# Patient Record
Sex: Female | Born: 1960
Health system: Southern US, Community
[De-identification: ages and names within clinical notes are randomized; demographics above are authoritative.]

## PROBLEM LIST (undated history)

## (undated) DIAGNOSIS — S83106A Unspecified dislocation of unspecified knee, initial encounter: Secondary | ICD-10-CM

## (undated) DIAGNOSIS — J9819 Other pulmonary collapse: Secondary | ICD-10-CM

## (undated) DIAGNOSIS — K648 Other hemorrhoids: Secondary | ICD-10-CM

## (undated) DIAGNOSIS — I619 Nontraumatic intracerebral hemorrhage, unspecified: Secondary | ICD-10-CM

## (undated) DIAGNOSIS — M329 Systemic lupus erythematosus, unspecified: Secondary | ICD-10-CM

## (undated) DIAGNOSIS — S6291XA Unspecified fracture of right wrist and hand, initial encounter for closed fracture: Secondary | ICD-10-CM

## (undated) DIAGNOSIS — D860 Sarcoidosis of lung: Secondary | ICD-10-CM

## (undated) DIAGNOSIS — D126 Benign neoplasm of colon, unspecified: Secondary | ICD-10-CM

## (undated) DIAGNOSIS — N029 Recurrent and persistent hematuria with unspecified morphologic changes: Secondary | ICD-10-CM

## (undated) DIAGNOSIS — IMO0002 Reserved for concepts with insufficient information to code with codable children: Secondary | ICD-10-CM

## (undated) DIAGNOSIS — S42009A Fracture of unspecified part of unspecified clavicle, initial encounter for closed fracture: Secondary | ICD-10-CM

## (undated) HISTORY — DX: Unspecified fracture of right wrist and hand, initial encounter for closed fracture: S62.91XA

## (undated) HISTORY — DX: Nontraumatic intracerebral hemorrhage, unspecified: I61.9

## (undated) HISTORY — DX: Sarcoidosis of lung: D86.0

## (undated) HISTORY — DX: Other pulmonary collapse: J98.19

## (undated) HISTORY — DX: Systemic lupus erythematosus, unspecified: M32.9

## (undated) HISTORY — DX: Other hemorrhoids: K64.8

## (undated) HISTORY — DX: Benign neoplasm of colon, unspecified: D12.6

## (undated) HISTORY — PX: ARTERIAL BYPASS SURGRY: SHX557

## (undated) HISTORY — DX: Fracture of unspecified part of unspecified clavicle, initial encounter for closed fracture: S42.009A

## (undated) HISTORY — PX: CYSTOSCOPY: SUR368

## (undated) HISTORY — DX: Recurrent and persistent hematuria with unspecified morphologic changes: N02.9

## (undated) HISTORY — PX: COLLATERAL LIGAMENT REPAIR, KNEE: SHX601

## (undated) HISTORY — DX: Reserved for concepts with insufficient information to code with codable children: IMO0002

## (undated) HISTORY — DX: Unspecified dislocation of unspecified knee, initial encounter: S83.106A

---

## 1997-09-09 ENCOUNTER — Ambulatory Visit (HOSPITAL_COMMUNITY): Admission: RE | Admit: 1997-09-09 | Discharge: 1997-09-09 | Payer: Self-pay | Admitting: Obstetrics and Gynecology

## 1997-10-01 ENCOUNTER — Ambulatory Visit (HOSPITAL_COMMUNITY): Admission: RE | Admit: 1997-10-01 | Discharge: 1997-10-01 | Payer: Self-pay | Admitting: Obstetrics and Gynecology

## 1997-10-11 ENCOUNTER — Inpatient Hospital Stay (HOSPITAL_COMMUNITY): Admission: AD | Admit: 1997-10-11 | Discharge: 1997-10-11 | Payer: Self-pay | Admitting: Obstetrics and Gynecology

## 1997-10-12 ENCOUNTER — Encounter (HOSPITAL_COMMUNITY): Admission: RE | Admit: 1997-10-12 | Discharge: 1997-11-08 | Payer: Self-pay | Admitting: Obstetrics and Gynecology

## 1997-11-01 ENCOUNTER — Inpatient Hospital Stay (HOSPITAL_COMMUNITY): Admission: AD | Admit: 1997-11-01 | Discharge: 1997-11-01 | Payer: Self-pay | Admitting: Obstetrics & Gynecology

## 1997-11-02 ENCOUNTER — Inpatient Hospital Stay (HOSPITAL_COMMUNITY): Admission: AD | Admit: 1997-11-02 | Discharge: 1997-11-08 | Payer: Self-pay | Admitting: Obstetrics and Gynecology

## 1997-11-12 ENCOUNTER — Other Ambulatory Visit: Admission: RE | Admit: 1997-11-12 | Discharge: 1997-11-12 | Payer: Self-pay | Admitting: Obstetrics & Gynecology

## 1997-11-17 ENCOUNTER — Encounter (HOSPITAL_COMMUNITY): Admission: RE | Admit: 1997-11-17 | Discharge: 1998-02-15 | Payer: Self-pay | Admitting: *Deleted

## 1997-12-13 ENCOUNTER — Ambulatory Visit (HOSPITAL_COMMUNITY): Admission: RE | Admit: 1997-12-13 | Discharge: 1997-12-13 | Payer: Self-pay | Admitting: Obstetrics and Gynecology

## 1998-06-23 ENCOUNTER — Other Ambulatory Visit: Admission: RE | Admit: 1998-06-23 | Discharge: 1998-06-23 | Payer: Self-pay | Admitting: Oral Surgery

## 1999-08-07 HISTORY — PX: SPLENECTOMY: SUR1306

## 1999-08-07 HISTORY — PX: OTHER SURGICAL HISTORY: SHX169

## 2000-02-26 ENCOUNTER — Other Ambulatory Visit: Admission: RE | Admit: 2000-02-26 | Discharge: 2000-02-26 | Payer: Self-pay | Admitting: Obstetrics and Gynecology

## 2000-02-29 ENCOUNTER — Encounter: Payer: Self-pay | Admitting: Obstetrics and Gynecology

## 2000-02-29 ENCOUNTER — Ambulatory Visit (HOSPITAL_COMMUNITY): Admission: RE | Admit: 2000-02-29 | Discharge: 2000-02-29 | Payer: Self-pay | Admitting: Obstetrics and Gynecology

## 2000-12-29 ENCOUNTER — Inpatient Hospital Stay (HOSPITAL_COMMUNITY): Admission: AC | Admit: 2000-12-29 | Discharge: 2001-01-21 | Payer: Self-pay | Admitting: *Deleted

## 2000-12-29 ENCOUNTER — Encounter (INDEPENDENT_AMBULATORY_CARE_PROVIDER_SITE_OTHER): Payer: Self-pay | Admitting: Specialist

## 2000-12-30 ENCOUNTER — Encounter: Payer: Self-pay | Admitting: General Surgery

## 2000-12-30 ENCOUNTER — Encounter: Payer: Self-pay | Admitting: Orthopedic Surgery

## 2001-01-01 ENCOUNTER — Encounter: Payer: Self-pay | Admitting: General Surgery

## 2001-01-02 ENCOUNTER — Encounter: Payer: Self-pay | Admitting: General Surgery

## 2001-01-06 ENCOUNTER — Encounter: Payer: Self-pay | Admitting: Orthopedic Surgery

## 2001-01-07 ENCOUNTER — Encounter: Payer: Self-pay | Admitting: General Surgery

## 2001-01-09 ENCOUNTER — Encounter: Payer: Self-pay | Admitting: General Surgery

## 2001-01-10 ENCOUNTER — Encounter: Payer: Self-pay | Admitting: General Surgery

## 2001-01-12 ENCOUNTER — Encounter: Payer: Self-pay | Admitting: General Surgery

## 2001-01-15 ENCOUNTER — Encounter: Payer: Self-pay | Admitting: General Surgery

## 2001-01-21 ENCOUNTER — Inpatient Hospital Stay (HOSPITAL_COMMUNITY)
Admission: RE | Admit: 2001-01-21 | Discharge: 2001-01-28 | Payer: Self-pay | Admitting: Physical Medicine & Rehabilitation

## 2001-02-04 ENCOUNTER — Encounter: Admission: RE | Admit: 2001-02-04 | Discharge: 2001-04-17 | Payer: Self-pay | Admitting: Orthopedic Surgery

## 2001-02-19 ENCOUNTER — Encounter: Admission: RE | Admit: 2001-02-19 | Discharge: 2001-04-23 | Payer: Self-pay | Admitting: Orthopaedic Surgery

## 2001-08-06 HISTORY — PX: HERNIA REPAIR: SHX51

## 2001-12-18 ENCOUNTER — Ambulatory Visit (HOSPITAL_COMMUNITY): Admission: RE | Admit: 2001-12-18 | Discharge: 2001-12-18 | Payer: Self-pay | Admitting: Orthopaedic Surgery

## 2001-12-18 ENCOUNTER — Encounter: Payer: Self-pay | Admitting: Orthopaedic Surgery

## 2002-01-05 ENCOUNTER — Ambulatory Visit (HOSPITAL_COMMUNITY): Admission: RE | Admit: 2002-01-05 | Discharge: 2002-01-05 | Payer: Self-pay | Admitting: *Deleted

## 2002-01-06 ENCOUNTER — Ambulatory Visit (HOSPITAL_BASED_OUTPATIENT_CLINIC_OR_DEPARTMENT_OTHER): Admission: RE | Admit: 2002-01-06 | Discharge: 2002-01-06 | Payer: Self-pay | Admitting: Oral Surgery

## 2002-02-20 ENCOUNTER — Encounter: Payer: Self-pay | Admitting: Surgery

## 2002-02-27 ENCOUNTER — Inpatient Hospital Stay (HOSPITAL_COMMUNITY): Admission: RE | Admit: 2002-02-27 | Discharge: 2002-03-01 | Payer: Self-pay | Admitting: Surgery

## 2002-05-25 ENCOUNTER — Other Ambulatory Visit: Admission: RE | Admit: 2002-05-25 | Discharge: 2002-05-25 | Payer: Self-pay | Admitting: Obstetrics and Gynecology

## 2002-08-25 ENCOUNTER — Encounter: Payer: Self-pay | Admitting: Family Medicine

## 2002-08-25 ENCOUNTER — Ambulatory Visit (HOSPITAL_COMMUNITY): Admission: RE | Admit: 2002-08-25 | Discharge: 2002-08-25 | Payer: Self-pay | Admitting: Family Medicine

## 2003-01-26 ENCOUNTER — Encounter: Payer: Self-pay | Admitting: *Deleted

## 2003-01-26 ENCOUNTER — Ambulatory Visit (HOSPITAL_COMMUNITY): Admission: RE | Admit: 2003-01-26 | Discharge: 2003-01-26 | Payer: Self-pay | Admitting: *Deleted

## 2003-04-06 ENCOUNTER — Encounter: Admission: RE | Admit: 2003-04-06 | Discharge: 2003-07-05 | Payer: Self-pay | Admitting: Orthopaedic Surgery

## 2003-05-27 ENCOUNTER — Other Ambulatory Visit: Admission: RE | Admit: 2003-05-27 | Discharge: 2003-05-27 | Payer: Self-pay | Admitting: Obstetrics and Gynecology

## 2003-10-19 ENCOUNTER — Ambulatory Visit (HOSPITAL_BASED_OUTPATIENT_CLINIC_OR_DEPARTMENT_OTHER): Admission: RE | Admit: 2003-10-19 | Discharge: 2003-10-19 | Payer: Self-pay | Admitting: Orthopaedic Surgery

## 2003-11-05 ENCOUNTER — Encounter: Admission: RE | Admit: 2003-11-05 | Discharge: 2004-02-03 | Payer: Self-pay | Admitting: Orthopaedic Surgery

## 2004-02-04 ENCOUNTER — Encounter: Admission: RE | Admit: 2004-02-04 | Discharge: 2004-03-09 | Payer: Self-pay | Admitting: Orthopaedic Surgery

## 2004-05-12 ENCOUNTER — Ambulatory Visit (HOSPITAL_COMMUNITY): Admission: RE | Admit: 2004-05-12 | Discharge: 2004-05-12 | Payer: Self-pay | Admitting: Obstetrics and Gynecology

## 2004-07-04 ENCOUNTER — Other Ambulatory Visit: Admission: RE | Admit: 2004-07-04 | Discharge: 2004-07-04 | Payer: Self-pay | Admitting: Obstetrics and Gynecology

## 2004-10-16 ENCOUNTER — Ambulatory Visit: Payer: Self-pay | Admitting: Family Medicine

## 2005-03-20 ENCOUNTER — Ambulatory Visit: Payer: Self-pay | Admitting: Family Medicine

## 2005-08-10 ENCOUNTER — Ambulatory Visit (HOSPITAL_COMMUNITY): Admission: RE | Admit: 2005-08-10 | Discharge: 2005-08-10 | Payer: Self-pay | Admitting: Obstetrics and Gynecology

## 2005-08-22 ENCOUNTER — Other Ambulatory Visit: Admission: RE | Admit: 2005-08-22 | Discharge: 2005-08-22 | Payer: Self-pay | Admitting: Obstetrics and Gynecology

## 2006-01-22 ENCOUNTER — Other Ambulatory Visit: Admission: RE | Admit: 2006-01-22 | Discharge: 2006-01-22 | Payer: Self-pay | Admitting: Obstetrics and Gynecology

## 2006-04-22 ENCOUNTER — Other Ambulatory Visit: Admission: RE | Admit: 2006-04-22 | Discharge: 2006-04-22 | Payer: Self-pay | Admitting: Obstetrics and Gynecology

## 2007-01-23 ENCOUNTER — Ambulatory Visit: Payer: Self-pay | Admitting: Family Medicine

## 2007-01-23 LAB — CONVERTED CEMR LAB
Albumin: 3.5 g/dL (ref 3.5–5.2)
Alkaline Phosphatase: 93 units/L (ref 39–117)
BUN: 11 mg/dL (ref 6–23)
Basophils Absolute: 0.1 10*3/uL (ref 0.0–0.1)
Chloride: 107 meq/L (ref 96–112)
Cholesterol: 234 mg/dL (ref 0–200)
Creatinine, Ser: 0.7 mg/dL (ref 0.4–1.2)
MCHC: 34.3 g/dL (ref 30.0–36.0)
Monocytes Relative: 16.5 % — ABNORMAL HIGH (ref 3.0–11.0)
Platelets: 265 10*3/uL (ref 150–400)
Potassium: 3.9 meq/L (ref 3.5–5.1)
RBC: 4.26 M/uL (ref 3.87–5.11)
RDW: 12.9 % (ref 11.5–14.6)
TSH: 1.89 microintl units/mL (ref 0.35–5.50)
Total Bilirubin: 0.7 mg/dL (ref 0.3–1.2)
Total CHOL/HDL Ratio: 5.4
Triglycerides: 86 mg/dL (ref 0–149)

## 2007-03-03 ENCOUNTER — Inpatient Hospital Stay (HOSPITAL_COMMUNITY): Admission: EM | Admit: 2007-03-03 | Discharge: 2007-03-10 | Payer: Self-pay | Admitting: Orthopaedic Surgery

## 2007-03-03 ENCOUNTER — Encounter: Payer: Self-pay | Admitting: Emergency Medicine

## 2007-03-03 ENCOUNTER — Ambulatory Visit: Payer: Self-pay | Admitting: Vascular Surgery

## 2007-03-18 ENCOUNTER — Encounter: Payer: Self-pay | Admitting: Family Medicine

## 2007-03-18 ENCOUNTER — Ambulatory Visit: Payer: Self-pay | Admitting: Vascular Surgery

## 2007-05-08 ENCOUNTER — Encounter: Admission: RE | Admit: 2007-05-08 | Discharge: 2007-08-06 | Payer: Self-pay | Admitting: Orthopaedic Surgery

## 2007-05-12 ENCOUNTER — Ambulatory Visit: Payer: Self-pay | Admitting: Surgery

## 2007-06-17 ENCOUNTER — Ambulatory Visit: Payer: Self-pay | Admitting: Vascular Surgery

## 2007-06-23 ENCOUNTER — Ambulatory Visit (HOSPITAL_COMMUNITY): Admission: RE | Admit: 2007-06-23 | Discharge: 2007-06-23 | Payer: Self-pay | Admitting: Obstetrics and Gynecology

## 2007-08-08 ENCOUNTER — Encounter: Admission: RE | Admit: 2007-08-08 | Discharge: 2007-11-06 | Payer: Self-pay | Admitting: Orthopaedic Surgery

## 2007-08-12 ENCOUNTER — Ambulatory Visit (HOSPITAL_BASED_OUTPATIENT_CLINIC_OR_DEPARTMENT_OTHER): Admission: RE | Admit: 2007-08-12 | Discharge: 2007-08-13 | Payer: Self-pay | Admitting: Orthopaedic Surgery

## 2007-08-19 ENCOUNTER — Encounter: Admission: RE | Admit: 2007-08-19 | Discharge: 2007-10-29 | Payer: Self-pay | Admitting: Orthopaedic Surgery

## 2007-09-01 ENCOUNTER — Encounter: Payer: Self-pay | Admitting: Family Medicine

## 2007-09-01 ENCOUNTER — Ambulatory Visit: Payer: Self-pay | Admitting: Vascular Surgery

## 2007-10-20 ENCOUNTER — Telehealth: Payer: Self-pay | Admitting: Family Medicine

## 2007-11-06 ENCOUNTER — Ambulatory Visit: Payer: Self-pay | Admitting: Family Medicine

## 2007-11-06 DIAGNOSIS — M329 Systemic lupus erythematosus, unspecified: Secondary | ICD-10-CM | POA: Insufficient documentation

## 2007-11-06 DIAGNOSIS — E663 Overweight: Secondary | ICD-10-CM | POA: Insufficient documentation

## 2007-11-06 DIAGNOSIS — D869 Sarcoidosis, unspecified: Secondary | ICD-10-CM | POA: Insufficient documentation

## 2007-12-30 ENCOUNTER — Ambulatory Visit: Payer: Self-pay | Admitting: Family Medicine

## 2007-12-30 LAB — CONVERTED CEMR LAB
Alkaline Phosphatase: 97 units/L (ref 39–117)
Basophils Absolute: 0.1 10*3/uL (ref 0.0–0.1)
Bilirubin, Direct: 0.1 mg/dL (ref 0.0–0.3)
Blood in Urine, dipstick: NEGATIVE
Calcium: 9.6 mg/dL (ref 8.4–10.5)
Cholesterol: 197 mg/dL (ref 0–200)
Eosinophils Absolute: 0.1 10*3/uL (ref 0.0–0.7)
GFR calc Af Amer: 99 mL/min
GFR calc non Af Amer: 82 mL/min
HCT: 43.6 % (ref 36.0–46.0)
HDL: 37.4 mg/dL — ABNORMAL LOW (ref >39.0)
MCHC: 33.7 g/dL (ref 30.0–36.0)
MCV: 96.1 fL (ref 78.0–100.0)
Monocytes Absolute: 0.8 10*3/uL (ref 0.1–1.0)
Nitrite: NEGATIVE
Platelets: 252 10*3/uL (ref 150–400)
Potassium: 4.2 meq/L (ref 3.5–5.1)
RDW: 14 % (ref 11.5–14.6)
Sodium: 141 meq/L (ref 135–145)
Specific Gravity, Urine: 1.02
Total CHOL/HDL Ratio: 5.3
Triglycerides: 87 mg/dL (ref 0–149)
pH: 6

## 2008-01-06 ENCOUNTER — Ambulatory Visit: Payer: Self-pay | Admitting: Family Medicine

## 2008-04-08 ENCOUNTER — Telehealth: Payer: Self-pay | Admitting: Family Medicine

## 2008-07-12 ENCOUNTER — Ambulatory Visit (HOSPITAL_COMMUNITY): Admission: RE | Admit: 2008-07-12 | Discharge: 2008-07-12 | Payer: Self-pay | Admitting: Obstetrics and Gynecology

## 2009-06-08 ENCOUNTER — Ambulatory Visit: Payer: Self-pay | Admitting: Family Medicine

## 2009-06-08 LAB — CONVERTED CEMR LAB
Bilirubin Urine: NEGATIVE
Glucose, Urine, Semiquant: NEGATIVE
Ketones, urine, test strip: NEGATIVE
Protein, U semiquant: NEGATIVE
Urobilinogen, UA: 0.2
pH: 5

## 2009-06-10 LAB — CONVERTED CEMR LAB
ALT: 32 units/L (ref 0–35)
AST: 35 units/L (ref 0–37)
Albumin: 3.8 g/dL (ref 3.5–5.2)
Alkaline Phosphatase: 101 units/L (ref 39–117)
BUN: 9 mg/dL (ref 6–23)
Basophils Absolute: 0 10*3/uL (ref 0.0–0.1)
Basophils Relative: 0.9 % (ref 0.0–3.0)
Bilirubin, Direct: 0.1 mg/dL (ref 0.0–0.3)
CO2: 27 meq/L (ref 19–32)
Calcium: 9.1 mg/dL (ref 8.4–10.5)
Chloride: 103 meq/L (ref 96–112)
Cholesterol: 195 mg/dL (ref 0–200)
Creatinine, Ser: 0.7 mg/dL (ref 0.4–1.2)
Eosinophils Absolute: 0.2 10*3/uL (ref 0.0–0.7)
Eosinophils Relative: 2.8 % (ref 0.0–5.0)
GFR calc non Af Amer: 114.3 mL/min (ref >60)
Glucose, Bld: 83 mg/dL (ref 70–99)
HCT: 41.7 % (ref 36.0–46.0)
HDL: 44.1 mg/dL (ref >39.00)
Hemoglobin: 14.3 g/dL (ref 12.0–15.0)
LDL Cholesterol: 126 mg/dL — ABNORMAL HIGH (ref 0–99)
Lymphocytes Relative: 31.7 % (ref 12.0–46.0)
Lymphs Abs: 1.7 10*3/uL (ref 0.7–4.0)
MCHC: 34.2 g/dL (ref 30.0–36.0)
MCV: 100 fL (ref 78.0–100.0)
Monocytes Absolute: 0.5 10*3/uL (ref 0.1–1.0)
Monocytes Relative: 9.7 % (ref 3.0–12.0)
Neutro Abs: 3 10*3/uL (ref 1.4–7.7)
Neutrophils Relative %: 54.9 % (ref 43.0–77.0)
Platelets: 164 10*3/uL (ref 150.0–400.0)
Potassium: 3.9 meq/L (ref 3.5–5.1)
RBC: 4.16 M/uL (ref 3.87–5.11)
RDW: 12.6 % (ref 11.5–14.6)
Sodium: 141 meq/L (ref 135–145)
TSH: 1.87 microintl units/mL (ref 0.35–5.50)
Testosterone: 37.01 ng/dL (ref 10.00–70.00)
Total Bilirubin: 0.9 mg/dL (ref 0.3–1.2)
Total CHOL/HDL Ratio: 4
Total Protein: 8.2 g/dL (ref 6.0–8.3)
Triglycerides: 125 mg/dL (ref 0.0–149.0)
VLDL: 25 mg/dL (ref 0.0–40.0)
WBC: 5.4 10*3/uL (ref 4.5–10.5)

## 2009-06-21 ENCOUNTER — Ambulatory Visit: Payer: Self-pay | Admitting: Family Medicine

## 2009-06-22 ENCOUNTER — Telehealth: Payer: Self-pay | Admitting: Family Medicine

## 2009-07-05 ENCOUNTER — Telehealth: Payer: Self-pay | Admitting: Family Medicine

## 2009-07-13 ENCOUNTER — Ambulatory Visit (HOSPITAL_COMMUNITY): Admission: RE | Admit: 2009-07-13 | Discharge: 2009-07-13 | Payer: Self-pay | Admitting: Obstetrics and Gynecology

## 2010-08-27 ENCOUNTER — Encounter: Payer: Self-pay | Admitting: Obstetrics and Gynecology

## 2010-12-19 NOTE — Op Note (Signed)
NAME:  Stephanie Schmidt, Stephanie Schmidt            ACCOUNT NO.:  [id number]   MEDICAL RECORD NO.:  [id number]          PATIENT TYPE:  AMB   LOCATION:  DSC                          FACILITY:  [facility]   PHYSICIAN:  Lubertha Basque. Dalldorf, M.D.DATE OF BIRTH:  1961-05-12   DATE OF PROCEDURE:  08/12/2007  DATE OF DISCHARGE:                               OPERATIVE REPORT   PREOPERATIVE DIAGNOSIS:  1. Left knee instability.  2. Left knee chondromalacia.   POSTOPERATIVE DIAGNOSIS:  1. Left knee instability.  2. Left knee chondromalacia.   PROCEDURE:  1. Left knee anterior cruciate ligament reconstruction.  2. Left knee chondroplasty medial femoral condyle.   ANESTHESIA:  General and block.   ATTENDING SURGEON:  Lubertha Basque. Jerl Santos, M.D.   ASSISTANT:  Lindwood Qua, P.A.-C.   INDICATIONS FOR PROCEDURE:  The patient is a 50 year old woman with a  long complicated history of left knee difficulty.  She suffered a  dislocation several years ago treated with immobilization.  Unfortunately last year she suffered a repeat dislocation including the  complication of a vascular injury requiring repair.  She was treated for  instability with an external fixator for several months.  This was  subsequently removed.  The patient went through physical therapy and a  manipulation and now has been left with a stiff and unfortunately  unstable knee.  She is offered ACL reconstruction in hopes of  stabilizing this somewhat.  Informed operative consent was obtained  after a discussion of possible complications of reaction to anesthesia,  infection, neurovascular injury.   SUMMARY OF FINDINGS AND PROCEDURE:  Under general anesthesia and a  block, a left knee procedure was performed.  We started with an  arthroscopy.  Her motion was about 0 to 90 asleep and she had good  stability to varus and valgus stress.  She had a laxity to drawer and  Lachman's testing in both directions.  The arthroscopy revealed some  grade 3  change of the patellofemoral joint with grade 3 change here and  in a dime size area of the medial femoral condyle and some central grade  4 change in the medial compartment.  A chondroplasty was done here.  Meniscal structures were intact in both compartments.  The ACL was  absent while there was remnant PCL.  We reconstructed the ACL with  middle third patellar tendon allograft stabilized at both ends with  metal Linvatek screws.  Bryna Colander assisted throughout and fashioned  the graft on the back table while I performed most of the arthroscopy,  thereby significantly minimizing OR time.   DESCRIPTION OF PROCEDURE:  The patient was taken to the operating suite  where general anesthetic was applied without difficulty.  She was also  given a block in the preanesthesia area.  She was positioned supine and  prepped and draped in a normal sterile fashion.  After the surgery of IV  Kefzol, an arthroscopy of the left knee was performed through a total of  three portals.  Findings were as noted above.  The procedure consisted  of chondroplasty of the medial femoral condyle.  A conservative  notchplasty  was done with the bur until the over-the-top position was  well visualized.  I placed a guide into the knee and utilized this to  pass a guidewire through the proximal tibia up into the knee just  anterior to the PCL.  We over reamed this to a diameter of 11 mm through  a separate anteromedial incision on the tibia.  I then placed a guide  through this tunnel up into the over-the-top position.  Utilizing this,  I passed a guidewire through the tibial tunnel, the femur, and out the  proximal thigh.  We over reamed the distal femur over this guidewire to  a depth of 3 cm in diameter of 10 mm with a 1 or 2 mm posterior wall  well visualized.  Bony debris was removed from the knee with a shaver.  An allograft was defrosted and tensioned and contoured by Bryna Colander,  to fit through 9 and 10 mm  tunnels.  Drill holes were placed in each of  the bone plugs with a PDS suture placed in one and a wire placed in the  other.  We pulled this graft into position through both tunnels.  I  placed a guidewire into the anterior position in the femoral tunnel and  over this, placed in an 8 x 25 metal Linvatek screw securing this.  The  knee then ranged fully.  I placed a second guidewire up into the knee  seen arthroscopically through the tibial tunnel.  Over this, I placed  initially an 8 x 25 metal Linvatek screw but was not happy with the  fixation and subsequently placed a 9 x 25 screw with better fixation  achieved.  Again, the knee ranged fully and the graft was taut.  Arthroscopic equipment was removed followed by reapproximation of  subcutaneous tissues of the incision with 2-0 undyed Vicryl and skin  closure with nylon.  Adaptic was placed over the wounds followed by dry  gauze and loose Ace wrap and knee immobilizer.  Estimated blood loss and  intraoperative fluids can be obtained from anesthesia records.  No  tourniquet was placed.  At the end of the case, she had the same  dopplerable pulses in her foot as she had preoperatively.   DISPOSITION:  The patient was extubated in the operating room and taken  to the recovery room in stable addition.  She was to be admitted for  overnight observation for pain control and will likely go home in the  morning.      Lubertha Basque Jerl Santos, M.D.  Electronically Signed     PGD/MEDQ  D:  08/12/2007  T:  08/12/2007  Job:  [id number]
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
## 2010-12-19 NOTE — Procedures (Signed)
BYPASS GRAFT EVALUATION   INDICATION:  Follow-up evaluation of left leg bypass graft.   HISTORY:  Diabetes:  No.  Cardiac:  No.  Hypertension:  No.  Smoking:  Quit in 1988.  Previous Surgery:  Left above-knee to below-knee pop bypass graft with  nonreversed saphenous vein on 03/03/07.   SINGLE LEVEL ARTERIAL EXAM                               RIGHT              LEFT  Brachial:                    108                108  Anterior tibial:             112                96  Posterior tibial:            100                108  Peroneal:  Ankle/brachial index:        >1.0               >1.0   PREVIOUS ABI:  Date: 05/12/07  RIGHT:  >1.0  LEFT:  >1.0   LOWER EXTREMITY BYPASS GRAFT DUPLEX EXAM:   DUPLEX:  Doppler arterial waveforms are biphasic to triphasic proximal  to, within, and distal to the left above-knee to below-knee popliteal  bypass graft.   IMPRESSION:  1. Ankle brachial indices are stable from previous study bilaterally.  2. Patent left above-knee to below-knee popliteal bypass graft.   ___________________________________________  Quita Skye. Hart [place], M.D.   MC/MEDQ  D:  09/01/2007  T:  09/02/2007  Job:  [id number]

## 2010-12-19 NOTE — Assessment & Plan Note (Signed)
OFFICE VISIT   Cegielski, Star  DOB:  06-03-1961                                       03/18/2007  AOZHY#:[id number]   Ms. Stephanie Schmidt is status post left above knee to below knee popliteal bypass  using a nonreversal saphenous vein graft performed on July 28th for  injury to her left popliteal artery following a dislocation of her left  knee joint.  This was an anterior dislocation, and required external  fixation by Dr. Jerl Santos during the same operative procedure.  Her  bypass is functioning well with a palpable dorsalis pedis and posterior  tibial pulse in the right ankle.  She has 1+ edema distally.  Incision  in the medial aspect of the left from the mid thigh to the proximal calf  is healing nicely.   In general, she is getting along well from a vascular standpoint.  Will  return in 3 months to check ABIs and further followup.  Dr. Jerl Santos  will be removing the external fixation device in the next few weeks.   Stephanie Schmidt, M.D.  Electronically Signed   JDL/MEDQ  D:  03/18/2007  T:  03/20/2007  Job:  [id number]   cc:   Lubertha Basque. Jerl Santos, M.D.  Tera Mater. Clent Ridges, MD

## 2010-12-19 NOTE — Op Note (Signed)
NAME:  Stephanie Schmidt, Stephanie Schmidt            ACCOUNT NO.:  [id number]   MEDICAL RECORD NO.:  [id number]          PATIENT TYPE:  INP   LOCATION:  3311                         FACILITY:  [facility]   PHYSICIAN:  Quita Skye. Hart [place], M.D.  DATE OF BIRTH:  Jun 30, 1961   DATE OF PROCEDURE:  03/03/2007  DATE OF DISCHARGE:                               OPERATIVE REPORT   PREOPERATIVE DIAGNOSIS:  Ischemic left leg secondary to popliteal artery  injury, following anterior dislocation, left knee.   POSTOPERATIVE DIAGNOSIS:  Ischemic left leg secondary to popliteal  artery injury, following anterior dislocation, left knee.   OPERATIONS:  Left above-knee popliteal to below-knee popliteal bypass  using a nonreversed saphenous vein graft from left leg.   SURGEON:  Dr. Hart [place].   FIRST ASSISTANT:  Nurse.   ANESTHESIA:  General endotracheal.   INTRAOPERATIVE CONSULTATION:  Dr. Marcene Corning.   PROCEDURE:  The patient was taken to the operating room, placed in  supine position at which time satisfactory general endotracheal  anesthesia was administered.  Left leg was prepped with Betadine  scrubbing solution and draped in routine sterile manner.  Medial  incision was made below the knee to expose the below-knee popliteal  artery.  The knee itself was quite unstable, having been dislocated  hours earlier.  The saphenous vein was identified and was traced  proximally up to the midthigh.  It was an adequate vein for bypass, his  branches ligated with 4-0 and 5-0 silk ties and divided.  It was removed  gently, dilated heparinized saline and marked for orientation purposes.  Anatomy was quite distorted because of the dislocation.  The popliteal  artery was exposed through the below-knee vein harvesting incision just  proximal to the origin of the anterior tibial artery.  It was a soft  vessel that was pulseless.  Through the vein harvesting incision above-  the-knee, the popliteal artery was exposed just distal to  the adductor  canal where it had an excellent pulse.  There was difficulty determining  where the tunnel should be because of the distortion of the knee being  dislocated, and at that point, I consulted Dr. Marcene Corning who had  seen the patient initially for the problem, and he came to the operating  room, reduced the knee and was present while we completed the  anastomoses and then stabilized the knee with femoral and tibial pins.  A tunnel was made posterior to the knee in an anatomic position.  Then,  6000 units of heparin was given intravenously, the vein using a  nonreversed fashion, anastomosed end-to-side to the above-knee popliteal  artery with 6-0 Prolene.  The valves were then lysed using the  valvulotome with excellent flow.  A Fogarty catheter was passed  proximally.  No thrombus was removed, and there was excellent inflow.  After tunneling the vein appropriately, below-knee popliteal artery was  opened with a 15 blade.  A Fogarty was passed to the ankle level, and  one organized piece of thrombus was retrieved followed by good  backbleeding.  The vein was anastomosed end-to-side to the below-knee  popliteal artery  with  6-0 Prolene.  Vesseloops released.  There was excellent pulse and  good Doppler flow in the foot.  Protamine was given to reverse the  heparin following adequate hemostasis.  The wound closed in layers with  Vicryl in a subcuticular fashion, and Dr. Jerl Santos proceeded with  pinning the leg for stability.      Quita Skye Hart [place], M.D.  Electronically Signed     JDL/MEDQ  D:  03/04/2007  T:  03/04/2007  Job:  [id number]

## 2010-12-19 NOTE — Assessment & Plan Note (Signed)
OFFICE VISIT   Stephanie Schmidt, Stephanie Schmidt  DOB:  10-22-1960                                       06/17/2007  EAVWU#:[id number]   Patient is status post left above-the-knee/below-knee popliteal bypass  graft done as an emergency on July 28 following anterior dislocation of  the left knee.  The bypass continues to function well with an ABI of 1  and a recent duplex scan in October, which showed no evidence of any  increased velocity in the graft and a widely patent graft.  The left  foot is well perfused and has good dorsiflexion.  She does have some  numbness on the pretibial region of her left leg, which could be due to  nerve injury or ischemia at the time of her surgery.  Dr. Jerl Santos  continues to work on her range of motion of the left knee, and he will  be seeing her tomorrow for further followup.    The incisions in the left knee have healed nicely, and she does have 1-  2+ chronic edema.  I have reassured her regarding these findings, and  she will return on a regular schedule for the protocol for the left  femoral-popliteal graft.  Blood pressure today is 102/60, heart rate is  88, respirations 16.   Quita Skye. Hart [place], M.D.  Electronically Signed   JDL/MEDQ  D:  06/17/2007  T:  06/18/2007  Job:  [id number]

## 2010-12-19 NOTE — Op Note (Signed)
NAME:  Stender, Belky            ACCOUNT NO.:  [id number]   MEDICAL RECORD NO.:  [id number]          PATIENT TYPE:  AMB   LOCATION:  DSC                          FACILITY:  [facility]   PHYSICIAN:  Lubertha Basque. Dalldorf, M.D.DATE OF BIRTH:  [birth year]   DATE OF PROCEDURE:  DATE OF DISCHARGE:                               OPERATIVE REPORT   No dictation for this job number.      Lubertha Basque Jerl Santos, M.D.     PGD/MEDQ  D:  08/12/2007  T:  08/12/2007  Job:  [id number]

## 2010-12-19 NOTE — Procedures (Signed)
BYPASS GRAFT EVALUATION   INDICATION:  Follow-up evaluation of lower extremity bypass graft.   HISTORY:  Diabetes:  No.  Cardiac:  No.  Hypertension:  No.  Smoking:  Quit in 1988.  Previous Surgery:  Left above-knee to below-knee pop bypass graft with  nonreversed saphenous vein on March 03, 2007.   SINGLE LEVEL ARTERIAL EXAM                               RIGHT              LEFT  Brachial:                    112                112  Anterior tibial:             118                118  Posterior tibial:            120                120  Peroneal:  Ankle/brachial index:        >1.0               >1.0   PREVIOUS ABI:  Date:  RIGHT:  LEFT:   LOWER EXTREMITY BYPASS GRAFT DUPLEX EXAM:   DUPLEX:  Triphasic proximal to, within, and distal to the above-knee to  below-knee pop bypass graft in the left leg.   IMPRESSION:  1. Patent left above-knee to below-knee popliteal bypass graft.  2. Ankle brachial indices suggest no significant lower extremity      arterial occlusive disease proximal to or distal to the bypass      graft.   ___________________________________________  Quita Skye. Hart [place], M.D.   MC/MEDQ  D:  05/12/2007  T:  05/13/2007  Job:  [id number]

## 2010-12-19 NOTE — Op Note (Signed)
NAME:  Stephanie Schmidt, Stephanie Schmidt            ACCOUNT NO.:  [id number]   MEDICAL RECORD NO.:  [id number]          PATIENT TYPE:  INP   LOCATION:  3311                         FACILITY:  [facility]   PHYSICIAN:  Lubertha Basque. Dalldorf, M.D.DATE OF BIRTH:  Nov 12, 1960   DATE OF PROCEDURE:  03/03/2007  DATE OF DISCHARGE:                               OPERATIVE REPORT   PREOPERATIVE DIAGNOSIS:  Left knee dislocation.   POSTOPERATIVE DIAGNOSIS:  Left knee dislocation.   PROCEDURE:  Left knee external fixation.   ANESTHESIA:  General.   ATTENDING SURGEON:  Lubertha Basque. Jerl Santos, M.D.   INDICATIONS FOR PROCEDURE:  The patient is a 50 year old woman who  suffered a knee dislocation falling down the stairs yesterday.  This is  actually the second dislocation for this knee with the first occurring  about six or seven years ago.  She was reduced in the emergency  department early this morning but a post reduction CT angiogram showed a  damaged area of the popliteal artery.  She was subsequently scheduled  for a bypass graft through Dr. Hart [place].  Intraoperatively her knee was  noted to be extremely unstable and we elected to place an external  fixator to stabilize the knee at least temporarily.   SUMMARY OF FINDINGS AND PROCEDURE:  Under general anesthesia, after the  end of the vascular case, we placed a Synthes external fixator with a  double frame stabilizing the knee in  near full extension.   DESCRIPTION OF PROCEDURE:  The patient was taken to the operating suite  where general anesthetic was applied without difficulty.  She was  positioned supine and underwent a vascular procedure by Dr. Jerilee Field.  The popliteal disruption was bypassed with a vein graft.  The  knee was found to be very unstable.  This was reduced intraoperatively.  When the vascular procedure was completed, I then stabilized the knee  with a Synthes fixator.  I placed 2 pins superior to the suprapatellar  pouch in the femur directly  anterior.  These were the long Shanz pins.  These were found to be achieving bicortical purchase by x-ray.  I then  placed two pins in the tibia distal to the knee joint and well away from  areas where we might have to  perform some reconstructive surgery.  These pins were in the safe area of the anteromedial quadrant.  Again,  two small stab wounds were made and two of the shorter Shanz pins were  placed again with bicortical purchase confirmed by fluoroscopy.  I then  placed short bars at each of the sites and a double stack connection  between the two bones was achieved.  I used a bar-to-bar clamp and two  medium-sized bars initially and used fluoroscopy to confirm that the  knee was accurately reduced.  I then placed a single long bar between  the two short bars.  All bolts were appropriately tightened.  We again  checked pulses distally with the Doppler and she had both anterior tib  and posterior tib pulses easily found.  We then irrigated around the pin  sites and  placed of strips of Xeroform.  Dry gauze and Ace wrap was then  applied.  Estimated blood loss and intraoperative fluids can be obtained  from the anesthesia records.   DISPOSITION:  The patient was extubated in the operating room and taken  to recovery room in stable condition.  Plans were for her to be admitted  to the orthopedic surgery service for appropriate vascular follow-up as  well.  She will receive perioperative antibiotics and heparin initially.      Lubertha Basque Jerl Santos, M.D.  Electronically Signed     PGD/MEDQ  D:  03/03/2007  T:  03/04/2007  Job:  [id number]
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
## 2010-12-22 NOTE — Op Note (Signed)
NAME:  Stephanie Schmidt, Stephanie Schmidt                        ACCOUNT NO.:  [id number]   MEDICAL RECORD NO.:  [id number]                   PATIENT TYPE:  AMB   LOCATION:  DSC                                  FACILITY:  [facility]   PHYSICIAN:  Lubertha Basque. Jerl Santos, M.D.             DATE OF BIRTH:  Jul 22, 1961   DATE OF PROCEDURE:  10/19/2003  DATE OF DISCHARGE:                                 OPERATIVE REPORT   PREOPERATIVE DIAGNOSIS:  Right knee torn medial meniscus.   POSTOPERATIVE DIAGNOSES:  1. Right knee torn lateral meniscus.  2. Right knee chondromalacia medial femoral condyle.   OPERATION PERFORMED:  1. Right knee partial lateral meniscectomy.  2. Right knee abrasion and drilling medial femoral condyle.   SURGEON:  Lubertha Basque. Jerl Santos, M.D.   ASSISTANT:  Prince Rome, P.A.   ANESTHESIA:  General.   INDICATIONS FOR PROCEDURE:  The patient is a 50 year old woman about three  years out from bilateral knee dislocations.  On the right side we  reconstructed medial, lateral and ACL and she has done fairly well in terms  of instability.  She has had some recent pain and a mechanical catch inside  her knee.  This makes it difficult for her to walk and can interrupt her  sleep at times.  She has failed oral anti-inflammatories and injections as  well as bracing.  She is offered an arthroscopy.  Informed operative consent  was obtained after discussion of possible complications of reaction to  anesthesia and infection.   DESCRIPTION OF PROCEDURE:  The patient was taken to the operating suite  where general anesthetic was applied without difficulty.  The patient was  positioned supine and prepped and draped in the normal sterile fashion.  After administration of preop IV Kefzol, an arthroscopy of the right knee  was performed through two old inferior portals.  Suprapatellar pouch was  benign while the patellofemoral joint exhibited some grade 3 change  addressed with thorough chondroplasty. The  medial compartment was notable  for a nickel sized area of bare bone with some loose flaps of articular  cartilage surrounding this area on the medial femoral condyle.  A thorough  chondroplasty was done.  I then took a 0.062 K-wire and drilled several  holes in this defect.  Pump pressure was decreased and all three or four  holes did bleed well.  The medial meniscus itself was probed and was found  to be intact.  There were no degenerative changes in the rest of the  compartment.  The ACL reconstruction appeared to be intact.  In the lateral  compartment she had some grade 2 change across broad areas but no exposed  bone.  She did have a degenerative tear of the middle horn of the lateral  meniscus addressed with 5% partial lateral meniscectomy back to stable  tissues.  The knee was thoroughly irrigated at the end of the case followed  by  placement of Marcaine with epinephrine and morphine.  Adaptic was placed  over the portals followed by dry gauze and a loose Ace wrap.  Estimated  blood loss and intraoperative fluids can be obtained from anesthesia  records.   DISPOSITION:  The patient was extubated in the operating room and taken to  the recovery room in stable condition.  Plans were for the patient to go  home the same day and to follow up in the office in less than a week.  I  will contact her by phone tonight.                                               Lubertha Basque Jerl Santos, M.D.    PGD/MEDQ  D:  10/19/2003  T:  10/20/2003  Job:  [id number]
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
## 2011-01-24 ENCOUNTER — Other Ambulatory Visit (HOSPITAL_COMMUNITY): Payer: Self-pay | Admitting: Obstetrics and Gynecology

## 2011-01-24 DIAGNOSIS — Z1231 Encounter for screening mammogram for malignant neoplasm of breast: Secondary | ICD-10-CM

## 2011-01-25 ENCOUNTER — Ambulatory Visit (HOSPITAL_COMMUNITY)
Admission: RE | Admit: 2011-01-25 | Discharge: 2011-01-25 | Disposition: A | Discharge status: Home / Self Care | Payer: Medicare Other | Source: Ambulatory Visit | Attending: Obstetrics and Gynecology | Admitting: Obstetrics and Gynecology

## 2011-01-25 DIAGNOSIS — Z1231 Encounter for screening mammogram for malignant neoplasm of breast: Secondary | ICD-10-CM

## 2011-01-29 ENCOUNTER — Other Ambulatory Visit: Payer: Self-pay

## 2011-02-15 ENCOUNTER — Encounter: Payer: Self-pay | Admitting: Family Medicine

## 2011-02-20 ENCOUNTER — Encounter: Payer: Self-pay | Admitting: Family Medicine

## 2011-02-20 ENCOUNTER — Ambulatory Visit (INDEPENDENT_AMBULATORY_CARE_PROVIDER_SITE_OTHER): Payer: Medicare Other | Admitting: Family Medicine

## 2011-02-20 VITALS — BP 104/70 | HR 71 | Temp 98.5°F | Ht 66.0 in | Wt 207.0 lb

## 2011-02-20 DIAGNOSIS — Z79899 Other long term (current) drug therapy: Secondary | ICD-10-CM

## 2011-02-20 DIAGNOSIS — Z136 Encounter for screening for cardiovascular disorders: Secondary | ICD-10-CM

## 2011-02-20 DIAGNOSIS — Z0000 Encounter for general adult medical examination without abnormal findings: Secondary | ICD-10-CM

## 2011-02-20 LAB — HEPATIC FUNCTION PANEL
Albumin: 4 g/dL (ref 3.5–5.2)
Alkaline Phosphatase: 97 U/L (ref 39–117)
Total Bilirubin: 0.5 mg/dL (ref 0.3–1.2)

## 2011-02-20 LAB — CBC WITH DIFFERENTIAL/PLATELET
Basophils Absolute: 0 10*3/uL (ref 0.0–0.1)
Eosinophils Absolute: 0.2 10*3/uL (ref 0.0–0.7)
Hemoglobin: 13.9 g/dL (ref 12.0–15.0)
Lymphocytes Relative: 31.9 % (ref 12.0–46.0)
Lymphs Abs: 2.4 10*3/uL (ref 0.7–4.0)
MCHC: 34.2 g/dL (ref 30.0–36.0)
Monocytes Relative: 11.2 % (ref 3.0–12.0)
Neutro Abs: 4 10*3/uL (ref 1.4–7.7)
Platelets: 226 10*3/uL (ref 150.0–400.0)
RDW: 14 % (ref 11.5–14.6)

## 2011-02-20 LAB — LIPID PANEL
Cholesterol: 173 mg/dL (ref 0–200)
HDL: 47.4 mg/dL (ref >39.00)
Triglycerides: 139 mg/dL (ref 0.0–149.0)
VLDL: 27.8 mg/dL (ref 0.0–40.0)

## 2011-02-20 LAB — BASIC METABOLIC PANEL
BUN: 12 mg/dL (ref 6–23)
CO2: 24 mEq/L (ref 19–32)
Calcium: 9 mg/dL (ref 8.4–10.5)
GFR: 106.47 mL/min (ref >60.00)
Glucose, Bld: 94 mg/dL (ref 70–99)
Sodium: 141 mEq/L (ref 135–145)

## 2011-02-20 LAB — POCT URINALYSIS DIPSTICK
Bilirubin, UA: NEGATIVE
Glucose, UA: NEGATIVE
Ketones, UA: NEGATIVE
Leukocytes, UA: NEGATIVE
Nitrite, UA: NEGATIVE

## 2011-02-20 MED ORDER — PHENTERMINE HCL 30 MG PO CAPS: 30.0000 mg | ORAL_CAPSULE | ORAL | Status: DC

## 2011-02-20 NOTE — Progress Notes (Signed)
  Subjective:    Patient ID: Stephanie Schmidt, female    DOB: 1961/04/14, 50 y.o.   MRN: [id number]  HPI 50 yr old female for a cpx. She feels well except for occasional knee pain, especially if she is on her feet a lot. She had used Advil or Naproxen in the past, but now these meds all cause too much GI upset.    Review of Systems  Constitutional: Negative.   HENT: Negative.   Eyes: Negative.   Respiratory: Negative.   Cardiovascular: Negative.   Gastrointestinal: Negative.   Genitourinary: Negative for dysuria, urgency, frequency, hematuria, flank pain, decreased urine volume, enuresis, difficulty urinating, pelvic pain and dyspareunia.  Musculoskeletal: Negative.   Skin: Negative.   Neurological: Negative.   Hematological: Negative.   Psychiatric/Behavioral: Negative.        Objective:   Physical Exam  Constitutional: She is oriented to person, place, and time. She appears well-developed and well-nourished. No distress.  HENT:  Head: Normocephalic and atraumatic.  Right Ear: External ear normal.  Left Ear: External ear normal.  Nose: Nose normal.  Mouth/Throat: Oropharynx is clear and moist. No oropharyngeal exudate.  Eyes: Conjunctivae and EOM are normal. Pupils are equal, round, and reactive to light. No scleral icterus.  Neck: Normal range of motion. Neck supple. No JVD present. No thyromegaly present.  Cardiovascular: Normal rate, regular rhythm, normal heart sounds and intact distal pulses.  Exam reveals no gallop and no friction rub.   No murmur heard.      EKG normal   Pulmonary/Chest: Effort normal and breath sounds normal. No respiratory distress. She has no wheezes. She has no rales. She exhibits no tenderness.  Abdominal: Soft. Bowel sounds are normal. She exhibits no distension and no mass. There is no tenderness. There is no rebound and no guarding.  Musculoskeletal: Normal range of motion. She exhibits no edema and no tenderness.  Lymphadenopathy:    She has no  cervical adenopathy.  Neurological: She is alert and oriented to person, place, and time. She has normal reflexes. No cranial nerve deficit. She exhibits normal muscle tone. Coordination normal.  Skin: Skin is warm and dry. No rash noted. No erythema.  Psychiatric: She has a normal mood and affect. Her behavior is normal. Judgment and thought content normal.          Assessment & Plan:  Get fasting labs. Set up a colonoscopy

## 2011-02-21 ENCOUNTER — Encounter: Payer: Self-pay | Admitting: Internal Medicine

## 2011-03-07 ENCOUNTER — Telehealth: Payer: Self-pay | Admitting: Family Medicine

## 2011-03-07 NOTE — Telephone Encounter (Signed)
Pt requested a copy of labs to be sent in mail, done today.

## 2011-03-08 ENCOUNTER — Ambulatory Visit (AMBULATORY_SURGERY_CENTER): Payer: Medicare Other | Admitting: *Deleted

## 2011-03-08 VITALS — Ht 66.0 in | Wt 206.8 lb

## 2011-03-08 DIAGNOSIS — Z1211 Encounter for screening for malignant neoplasm of colon: Secondary | ICD-10-CM

## 2011-03-08 MED ORDER — PEG-KCL-NACL-NASULF-NA ASC-C 100 G PO SOLR: ORAL | Status: DC

## 2011-03-22 ENCOUNTER — Ambulatory Visit (AMBULATORY_SURGERY_CENTER): Payer: Medicare Other | Admitting: Internal Medicine

## 2011-03-22 ENCOUNTER — Encounter: Payer: Self-pay | Admitting: Internal Medicine

## 2011-03-22 VITALS — BP 103/50 | HR 69 | Temp 97.2°F | Resp 18 | Ht 66.0 in | Wt 206.0 lb

## 2011-03-22 DIAGNOSIS — D126 Benign neoplasm of colon, unspecified: Secondary | ICD-10-CM

## 2011-03-22 DIAGNOSIS — Z1211 Encounter for screening for malignant neoplasm of colon: Secondary | ICD-10-CM

## 2011-03-22 DIAGNOSIS — D128 Benign neoplasm of rectum: Secondary | ICD-10-CM

## 2011-03-22 DIAGNOSIS — D129 Benign neoplasm of anus and anal canal: Secondary | ICD-10-CM

## 2011-03-22 MED ORDER — SODIUM CHLORIDE 0.9 % IV SOLN: 500.0000 mL | INTRAVENOUS | Status: DC

## 2011-03-22 NOTE — Patient Instructions (Signed)
Discharge instructions gone over and given to patient/caregiver. We will call in the morning for a follow-up. Handouts on polyps given to patient/caregiver.

## 2011-03-23 ENCOUNTER — Telehealth: Payer: Self-pay | Admitting: *Deleted

## 2011-03-23 NOTE — Telephone Encounter (Signed)

## 2011-03-28 ENCOUNTER — Encounter: Payer: Self-pay | Admitting: Internal Medicine

## 2011-05-21 LAB — CBC
Hemoglobin: 12.8
MCHC: 34
MCV: 94.7
RBC: 3.98
RDW: 13.5

## 2011-05-21 LAB — BASIC METABOLIC PANEL
CO2: 26
Chloride: 106
GFR calc Af Amer: >60
Glucose, Bld: 128 — ABNORMAL HIGH
Sodium: 136

## 2011-05-21 LAB — ABO/RH: ABO/RH(D): O POS

## 2011-05-21 LAB — HEPARIN LEVEL (UNFRACTIONATED): Heparin Unfractionated: 0.15 — ABNORMAL LOW

## 2011-05-21 LAB — TYPE AND SCREEN: Antibody Screen: NEGATIVE

## 2012-02-29 ENCOUNTER — Other Ambulatory Visit: Payer: Self-pay | Admitting: Family Medicine

## 2012-02-29 DIAGNOSIS — Z1231 Encounter for screening mammogram for malignant neoplasm of breast: Secondary | ICD-10-CM

## 2012-03-06 ENCOUNTER — Other Ambulatory Visit (INDEPENDENT_AMBULATORY_CARE_PROVIDER_SITE_OTHER): Payer: Medicare Other

## 2012-03-06 DIAGNOSIS — E663 Overweight: Secondary | ICD-10-CM

## 2012-03-06 DIAGNOSIS — Z0000 Encounter for general adult medical examination without abnormal findings: Secondary | ICD-10-CM

## 2012-03-06 DIAGNOSIS — M329 Systemic lupus erythematosus, unspecified: Secondary | ICD-10-CM

## 2012-03-06 LAB — BASIC METABOLIC PANEL
Calcium: 9.4 mg/dL (ref 8.4–10.5)
GFR: 101.28 mL/min (ref >60.00)
Glucose, Bld: 97 mg/dL (ref 70–99)
Sodium: 140 mEq/L (ref 135–145)

## 2012-03-06 LAB — POCT URINALYSIS DIPSTICK
Leukocytes, UA: NEGATIVE
Nitrite, UA: NEGATIVE
Protein, UA: NEGATIVE
Urobilinogen, UA: 0.2
pH, UA: 5.5

## 2012-03-06 LAB — CBC WITH DIFFERENTIAL/PLATELET
Basophils Absolute: 0 10*3/uL (ref 0.0–0.1)
Hemoglobin: 13.9 g/dL (ref 12.0–15.0)
Lymphocytes Relative: 36.7 % (ref 12.0–46.0)
Monocytes Relative: 11.3 % (ref 3.0–12.0)
Neutro Abs: 3.1 10*3/uL (ref 1.4–7.7)
RBC: 4.26 Mil/uL (ref 3.87–5.11)
RDW: 13.7 % (ref 11.5–14.6)
WBC: 6.4 10*3/uL (ref 4.5–10.5)

## 2012-03-06 LAB — HEPATIC FUNCTION PANEL
AST: 26 U/L (ref 0–37)
Albumin: 3.7 g/dL (ref 3.5–5.2)
Alkaline Phosphatase: 89 U/L (ref 39–117)
Total Bilirubin: 0.6 mg/dL (ref 0.3–1.2)

## 2012-03-06 LAB — LIPID PANEL
HDL: 48.4 mg/dL (ref >39.00)
Total CHOL/HDL Ratio: 4
VLDL: 24.4 mg/dL (ref 0.0–40.0)

## 2012-03-10 NOTE — Progress Notes (Signed)
Quick Note:  I spoke with pt ______ 

## 2012-03-19 ENCOUNTER — Ambulatory Visit (HOSPITAL_COMMUNITY)
Admission: RE | Admit: 2012-03-19 | Discharge: 2012-03-19 | Disposition: A | Discharge status: Home / Self Care | Payer: Medicare Other | Source: Ambulatory Visit | Attending: Family Medicine | Admitting: Family Medicine

## 2012-03-19 DIAGNOSIS — Z1231 Encounter for screening mammogram for malignant neoplasm of breast: Secondary | ICD-10-CM | POA: Insufficient documentation

## 2012-03-24 ENCOUNTER — Ambulatory Visit (INDEPENDENT_AMBULATORY_CARE_PROVIDER_SITE_OTHER): Payer: Medicare Other | Admitting: Family Medicine

## 2012-03-24 ENCOUNTER — Other Ambulatory Visit (HOSPITAL_COMMUNITY)
Admission: RE | Admit: 2012-03-24 | Discharge: 2012-03-24 | Disposition: A | Discharge status: Home / Self Care | Payer: Medicare Other | Source: Ambulatory Visit | Attending: Family Medicine | Admitting: Family Medicine

## 2012-03-24 ENCOUNTER — Encounter: Payer: Self-pay | Admitting: Family Medicine

## 2012-03-24 VITALS — BP 108/64 | HR 84 | Temp 98.8°F | Ht 66.0 in | Wt 202.0 lb

## 2012-03-24 DIAGNOSIS — Z01419 Encounter for gynecological examination (general) (routine) without abnormal findings: Secondary | ICD-10-CM | POA: Insufficient documentation

## 2012-03-24 DIAGNOSIS — Z0000 Encounter for general adult medical examination without abnormal findings: Secondary | ICD-10-CM

## 2012-03-24 MED ORDER — HYDROCODONE-ACETAMINOPHEN 5-500 MG PO TABS: 1.0000 | ORAL_TABLET | Freq: Four times a day (QID) | ORAL | Status: AC | PRN

## 2012-03-24 MED ORDER — PHENTERMINE HCL 37.5 MG PO CAPS: 37.5000 mg | ORAL_CAPSULE | ORAL | Status: AC

## 2012-03-24 MED ORDER — ESTROGENS, CONJUGATED 0.625 MG/GM VA CREA: TOPICAL_CREAM | Freq: Every day | VAGINAL | Status: AC

## 2012-03-24 NOTE — Addendum Note (Signed)
Addended by: Aniceto Boss A on: 03/24/2012 11:20 AM   Modules accepted: Orders

## 2012-03-24 NOTE — Progress Notes (Signed)
Subjective:    Patient ID: Stephanie Schmidt, female    DOB: 12-21-1960, 51 y.o.   MRN: [id number]  HPI 51 yr old female for a cpx. She feels fine except for some pain in both knees. She is trying to exercise at [employer] so she can lose weight. It has difficult for her to walk on the treadmill or do the step machine. She asks about something for vaginal dryness. She stopped having menses at the age of 48, and when she was seeing Dr. Pennie Rushing they discussed trying hormone replacement. However she was worried about side effects and chose not to do so.    Review of Systems  Constitutional: Negative.  Negative for fever, diaphoresis, activity change, appetite change, fatigue and unexpected weight change.  HENT: Negative.  Negative for hearing loss, ear pain, nosebleeds, congestion, sore throat, trouble swallowing, neck pain, neck stiffness, voice change and tinnitus.   Eyes: Negative.  Negative for photophobia, pain, discharge, redness and visual disturbance.  Respiratory: Negative.  Negative for apnea, cough, choking, chest tightness, shortness of breath, wheezing and stridor.   Cardiovascular: Negative.  Negative for chest pain, palpitations and leg swelling.  Gastrointestinal: Negative.  Negative for nausea, vomiting, abdominal pain, diarrhea, constipation, blood in stool, abdominal distention and rectal pain.  Genitourinary: Negative.  Negative for dysuria, urgency, frequency, hematuria, flank pain, vaginal bleeding, vaginal discharge, enuresis, difficulty urinating, vaginal pain and menstrual problem.  Musculoskeletal: Negative.  Negative for myalgias, back pain, joint swelling, arthralgias and gait problem.  Skin: Negative.  Negative for color change, pallor, rash and wound.  Neurological: Negative.  Negative for dizziness, tremors, seizures, syncope, speech difficulty, weakness, light-headedness, numbness and headaches.  Hematological: Negative.  Negative for adenopathy. Does not bruise/bleed  easily.  Psychiatric/Behavioral: Negative.  Negative for hallucinations, behavioral problems, confusion, disturbed wake/sleep cycle, dysphoric mood and agitation. The patient is not nervous/anxious.        Objective:   Physical Exam  Constitutional: She appears well-developed and well-nourished. No distress.  HENT:  Head: Normocephalic and atraumatic.  Right Ear: External ear normal.  Left Ear: External ear normal.  Nose: Nose normal.  Mouth/Throat: Oropharynx is clear and moist. No oropharyngeal exudate.  Eyes: Conjunctivae and EOM are normal. Pupils are equal, round, and reactive to light. Right eye exhibits no discharge. Left eye exhibits no discharge. No scleral icterus.  Neck: Normal range of motion. Neck supple. No JVD present. No thyromegaly present.  Cardiovascular: Normal rate, regular rhythm, normal heart sounds and intact distal pulses.  Exam reveals no gallop and no friction rub.   No murmur heard.      EKG normal   Pulmonary/Chest: Effort normal and breath sounds normal. No stridor. No respiratory distress. She has no wheezes. She has no rales. She exhibits no tenderness.  Abdominal: Soft. Normal appearance and bowel sounds are normal. She exhibits no distension, no abdominal bruit, no ascites and no mass. There is no hepatosplenomegaly. There is no tenderness. There is no rigidity, no rebound and no guarding. No hernia.  Genitourinary: Rectum normal, vagina normal and uterus normal. No breast swelling, tenderness, discharge or bleeding. Cervix exhibits no motion tenderness, no discharge and no friability. Right adnexum displays no mass, no tenderness and no fullness. Left adnexum displays no mass, no tenderness and no fullness. No erythema, tenderness or bleeding around the vagina. No vaginal discharge found.  Musculoskeletal: Normal range of motion. She exhibits no edema and no tenderness.  Lymphadenopathy:    She has no cervical adenopathy.  Neurological:  She is alert. She  has normal reflexes. No cranial nerve deficit. She exhibits normal muscle tone. Coordination normal.  Skin: Skin is warm and dry. No rash noted. She is not diaphoretic. No erythema. No pallor.  Psychiatric: She has a normal mood and affect. Her behavior is normal. Judgment and thought content normal.          Assessment & Plan:  Well exam. Refilled Phentermine to help her lose weight. Suggested she ride a stationery bicycle or an ellyptical machine so she can get hr weight off her knees. Use Vicodin for pain. Try Premarin vaginal cream.

## 2012-03-27 ENCOUNTER — Other Ambulatory Visit: Payer: Medicare Other

## 2012-03-28 NOTE — Progress Notes (Signed)
Quick Note:  I spoke with pt ______ 

## 2012-04-03 ENCOUNTER — Encounter: Payer: Medicare Other | Admitting: Family Medicine

## 2012-05-05 ENCOUNTER — Telehealth: Payer: Self-pay | Admitting: Family Medicine

## 2012-05-05 NOTE — Telephone Encounter (Signed)
Your pt Stephanie Schmidt would like Dr Clent Ridges to see her friend as new pt Stephanie Schmidt [phone].

## 2012-05-05 NOTE — Telephone Encounter (Signed)
Sorry but I am full. I suggest she see Dr. Selena Batten

## 2012-05-06 NOTE — Telephone Encounter (Signed)
Lm with monica hus( bobby)

## 2012-05-07 NOTE — Telephone Encounter (Signed)
Pt friend will see Dr Selena Batten

## 2013-04-30 ENCOUNTER — Telehealth: Payer: Self-pay | Admitting: Family Medicine

## 2013-04-30 NOTE — Telephone Encounter (Signed)
Pt can either see her GYN doctor if she has one, or schedule a office visit here, per Dr. Clent Ridges.

## 2013-04-30 NOTE — Telephone Encounter (Signed)
Pt is sch for tomorrow at 945am

## 2013-04-30 NOTE — Telephone Encounter (Signed)
Pt was last seen aug 2013. Pt has a boil on lips of her vagina and requesting the nurse to return her call.

## 2013-05-01 ENCOUNTER — Encounter: Payer: Self-pay | Admitting: Family Medicine

## 2013-05-01 ENCOUNTER — Ambulatory Visit (INDEPENDENT_AMBULATORY_CARE_PROVIDER_SITE_OTHER): Payer: Medicare Other | Admitting: Family Medicine

## 2013-05-01 VITALS — BP 110/72 | HR 68 | Temp 98.8°F | Wt 202.0 lb

## 2013-05-01 DIAGNOSIS — N907 Vulvar cyst: Secondary | ICD-10-CM

## 2013-05-01 DIAGNOSIS — N9089 Other specified noninflammatory disorders of vulva and perineum: Secondary | ICD-10-CM

## 2013-05-01 MED ORDER — DOXYCYCLINE HYCLATE 100 MG PO CAPS: 100.0000 mg | ORAL_CAPSULE | Freq: Two times a day (BID) | ORAL | Status: AC

## 2013-05-01 MED ORDER — HYDROCODONE-ACETAMINOPHEN 5-325 MG PO TABS: 1.0000 | ORAL_TABLET | Freq: Four times a day (QID) | ORAL | Status: DC | PRN

## 2013-05-01 NOTE — Progress Notes (Signed)
  Subjective:    Patient ID: Stephanie Schmidt, female    DOB: 10-Dec-1960, 52 y.o.   MRN: [id number]  HPI Here for 4 days of a tender lump on the left labia. No fever.    Review of Systems  Constitutional: Negative.   Genitourinary: Positive for genital sores.       Objective:   Physical Exam  Constitutional: She appears well-developed and well-nourished.  Genitourinary:  Tender boil on the left labia majora           Assessment & Plan:  I recommended that we lance this but she declined. Instead she will try hot Sitz baths and start on Doxycycline. Recheck if not better by next week

## 2013-05-28 ENCOUNTER — Other Ambulatory Visit: Payer: Self-pay | Admitting: Family Medicine

## 2013-05-28 ENCOUNTER — Other Ambulatory Visit (INDEPENDENT_AMBULATORY_CARE_PROVIDER_SITE_OTHER): Payer: Medicare Other

## 2013-05-28 DIAGNOSIS — Z1231 Encounter for screening mammogram for malignant neoplasm of breast: Secondary | ICD-10-CM

## 2013-05-28 DIAGNOSIS — Z0000 Encounter for general adult medical examination without abnormal findings: Secondary | ICD-10-CM

## 2013-05-28 DIAGNOSIS — R6882 Decreased libido: Secondary | ICD-10-CM

## 2013-05-28 LAB — LIPID PANEL
Cholesterol: 172 mg/dL (ref 0–200)
HDL: 44.2 mg/dL (ref >39.00)
LDL Cholesterol: 98 mg/dL (ref 0–99)
Total CHOL/HDL Ratio: 4
Triglycerides: 149 mg/dL (ref 0.0–149.0)

## 2013-05-28 LAB — HEPATIC FUNCTION PANEL
ALT: 33 U/L (ref 0–35)
AST: 42 U/L — ABNORMAL HIGH (ref 0–37)
Albumin: 3.6 g/dL (ref 3.5–5.2)
Alkaline Phosphatase: 99 U/L (ref 39–117)
Total Protein: 8.5 g/dL — ABNORMAL HIGH (ref 6.0–8.3)

## 2013-05-28 LAB — POCT URINALYSIS DIPSTICK
Bilirubin, UA: NEGATIVE
Nitrite, UA: NEGATIVE
Spec Grav, UA: 1.025
pH, UA: 5.5

## 2013-05-28 LAB — CBC WITH DIFFERENTIAL/PLATELET
Basophils Absolute: 0.1 10*3/uL (ref 0.0–0.1)
Lymphocytes Relative: 35.4 % (ref 12.0–46.0)
Monocytes Relative: 10.6 % (ref 3.0–12.0)
Platelets: 227 10*3/uL (ref 150.0–400.0)
RDW: 14.1 % (ref 11.5–14.6)

## 2013-05-28 LAB — BASIC METABOLIC PANEL
CO2: 23 mEq/L (ref 19–32)
Calcium: 9.1 mg/dL (ref 8.4–10.5)
Chloride: 104 mEq/L (ref 96–112)
Sodium: 137 mEq/L (ref 135–145)

## 2013-05-28 LAB — TSH: TSH: 1.93 u[IU]/mL (ref 0.35–5.50)

## 2013-06-01 NOTE — Progress Notes (Signed)
Quick Note:  Pt has appointment on 06/04/13 will go over then. ______

## 2013-06-04 ENCOUNTER — Encounter: Payer: Self-pay | Admitting: Family Medicine

## 2013-06-04 ENCOUNTER — Ambulatory Visit (INDEPENDENT_AMBULATORY_CARE_PROVIDER_SITE_OTHER): Payer: Medicare Other | Admitting: Family Medicine

## 2013-06-04 ENCOUNTER — Ambulatory Visit (INDEPENDENT_AMBULATORY_CARE_PROVIDER_SITE_OTHER)
Admission: RE | Admit: 2013-06-04 | Discharge: 2013-06-04 | Disposition: A | Discharge status: Home / Self Care | Payer: Medicare Other | Source: Ambulatory Visit | Attending: Family Medicine | Admitting: Family Medicine

## 2013-06-04 VITALS — BP 122/70 | HR 91 | Temp 98.1°F | Ht 65.75 in | Wt 200.0 lb

## 2013-06-04 DIAGNOSIS — D869 Sarcoidosis, unspecified: Secondary | ICD-10-CM

## 2013-06-04 DIAGNOSIS — Z0000 Encounter for general adult medical examination without abnormal findings: Secondary | ICD-10-CM

## 2013-06-04 NOTE — Progress Notes (Signed)
  Subjective:    Patient ID: Stephanie Schmidt, female    DOB: Nov 07, 1960, 52 y.o.   MRN: [id number]  HPI 52 yr old female for a cpx. She feels well in general but she does ask about a loss of libido. She went through menopause fairly rapidly at the age of 44. She denies any particular stress in her life. She has made arrangements to work out with a Systems analyst starting next week.    Review of Systems  Constitutional: Negative.   HENT: Negative.   Eyes: Negative.   Respiratory: Negative.   Cardiovascular: Negative.   Gastrointestinal: Negative.   Genitourinary: Negative for dysuria, urgency, frequency, hematuria, flank pain, decreased urine volume, enuresis, difficulty urinating, pelvic pain and dyspareunia.  Musculoskeletal: Negative.   Skin: Negative.   Neurological: Negative.   Psychiatric/Behavioral: Negative.        Objective:   Physical Exam  Constitutional: She is oriented to person, place, and time. She appears well-developed and well-nourished. No distress.  HENT:  Head: Normocephalic and atraumatic.  Right Ear: External ear normal.  Left Ear: External ear normal.  Nose: Nose normal.  Mouth/Throat: Oropharynx is clear and moist. No oropharyngeal exudate.  Eyes: Conjunctivae and EOM are normal. Pupils are equal, round, and reactive to light. No scleral icterus.  Neck: Normal range of motion. Neck supple. No JVD present. No thyromegaly present.  Cardiovascular: Normal rate, regular rhythm, normal heart sounds and intact distal pulses.  Exam reveals no gallop and no friction rub.   No murmur heard. EKG normal   Pulmonary/Chest: Effort normal and breath sounds normal. No respiratory distress. She has no wheezes. She has no rales. She exhibits no tenderness.  Abdominal: Soft. Bowel sounds are normal. She exhibits no distension and no mass. There is no tenderness. There is no rebound and no guarding.  Musculoskeletal: Normal range of motion. She exhibits no edema and no  tenderness.  Lymphadenopathy:    She has no cervical adenopathy.  Neurological: She is alert and oriented to person, place, and time. She has normal reflexes. No cranial nerve deficit. She exhibits normal muscle tone. Coordination normal.  Skin: Skin is warm and dry. No rash noted. No erythema.  Psychiatric: She has a normal mood and affect. Her behavior is normal. Judgment and thought content normal.          Assessment & Plan:  Well exam. As far as the loss of libido goes, I told her we could start her on a low dose of hormone replacement therapy. We went over the possible risks involved, including blood clots, breast cancer, heart disease, etc. She decided not to try this right now. I think her exercise program may help with this though.

## 2013-06-05 NOTE — Progress Notes (Signed)
Quick Note:  I left a voice message with results. ______ 

## 2013-06-16 ENCOUNTER — Ambulatory Visit (HOSPITAL_COMMUNITY)
Admission: RE | Admit: 2013-06-16 | Discharge: 2013-06-16 | Disposition: A | Discharge status: Home / Self Care | Payer: Medicare Other | Source: Ambulatory Visit | Attending: Family Medicine | Admitting: Family Medicine

## 2013-06-16 DIAGNOSIS — Z1231 Encounter for screening mammogram for malignant neoplasm of breast: Secondary | ICD-10-CM | POA: Insufficient documentation

## 2013-09-15 ENCOUNTER — Emergency Department (HOSPITAL_COMMUNITY)
Admission: EM | Admit: 2013-09-15 | Discharge: 2013-09-15 | Disposition: A | Discharge status: Home / Self Care | Payer: Worker's Compensation | Attending: Emergency Medicine | Admitting: Emergency Medicine

## 2013-09-15 ENCOUNTER — Encounter (HOSPITAL_COMMUNITY): Payer: Self-pay | Admitting: Emergency Medicine

## 2013-09-15 DIAGNOSIS — Z8679 Personal history of other diseases of the circulatory system: Secondary | ICD-10-CM | POA: Insufficient documentation

## 2013-09-15 DIAGNOSIS — Z87891 Personal history of nicotine dependence: Secondary | ICD-10-CM | POA: Insufficient documentation

## 2013-09-15 DIAGNOSIS — Z880 Allergy status to penicillin: Secondary | ICD-10-CM | POA: Insufficient documentation

## 2013-09-15 DIAGNOSIS — Z8619 Personal history of other infectious and parasitic diseases: Secondary | ICD-10-CM | POA: Insufficient documentation

## 2013-09-15 DIAGNOSIS — S300XXA Contusion of lower back and pelvis, initial encounter: Secondary | ICD-10-CM | POA: Insufficient documentation

## 2013-09-15 DIAGNOSIS — Y92410 Unspecified street and highway as the place of occurrence of the external cause: Secondary | ICD-10-CM | POA: Insufficient documentation

## 2013-09-15 DIAGNOSIS — Z87448 Personal history of other diseases of urinary system: Secondary | ICD-10-CM | POA: Insufficient documentation

## 2013-09-15 DIAGNOSIS — Y9389 Activity, other specified: Secondary | ICD-10-CM | POA: Insufficient documentation

## 2013-09-15 DIAGNOSIS — Z8739 Personal history of other diseases of the musculoskeletal system and connective tissue: Secondary | ICD-10-CM | POA: Insufficient documentation

## 2013-09-15 DIAGNOSIS — Z79899 Other long term (current) drug therapy: Secondary | ICD-10-CM | POA: Insufficient documentation

## 2013-09-15 DIAGNOSIS — Z8781 Personal history of (healed) traumatic fracture: Secondary | ICD-10-CM | POA: Insufficient documentation

## 2013-09-15 DIAGNOSIS — Z8709 Personal history of other diseases of the respiratory system: Secondary | ICD-10-CM | POA: Insufficient documentation

## 2013-09-15 NOTE — Discharge Instructions (Signed)
Use donut pillow. Take pain meds as discussed.  If you were given medicines take as directed.  If you are on coumadin or contraceptives realize their levels and effectiveness is altered by many different medicines.  If you have any reaction (rash, tongues swelling, other) to the medicines stop taking and see a physician.   Please follow up as directed and return to the ER or see a physician for new or worsening symptoms.  Thank you.
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
## 2013-09-15 NOTE — Progress Notes (Signed)
[facility] provided pt with a list of primary care resources and ACA information to help patient establish primary care.  °

## 2013-09-15 NOTE — ED Notes (Signed)
Pt escorted to discharge window. Verbalized understanding discharge instructions. In no acute distress.   

## 2014-07-13 ENCOUNTER — Other Ambulatory Visit: Payer: Self-pay | Admitting: Family Medicine

## 2014-07-13 DIAGNOSIS — Z1231 Encounter for screening mammogram for malignant neoplasm of breast: Secondary | ICD-10-CM

## 2014-07-26 ENCOUNTER — Ambulatory Visit (HOSPITAL_COMMUNITY)
Admission: RE | Admit: 2014-07-26 | Discharge: 2014-07-26 | Disposition: A | Discharge status: Home / Self Care | Payer: Medicare Other | Source: Ambulatory Visit | Attending: Family Medicine | Admitting: Family Medicine

## 2014-07-26 DIAGNOSIS — Z1231 Encounter for screening mammogram for malignant neoplasm of breast: Secondary | ICD-10-CM | POA: Insufficient documentation

## 2014-08-18 ENCOUNTER — Other Ambulatory Visit (INDEPENDENT_AMBULATORY_CARE_PROVIDER_SITE_OTHER): Payer: Medicare Other

## 2014-08-18 DIAGNOSIS — Z0000 Encounter for general adult medical examination without abnormal findings: Secondary | ICD-10-CM

## 2014-08-18 LAB — COMPREHENSIVE METABOLIC PANEL
ALBUMIN: 3.9 g/dL (ref 3.5–5.2)
ALK PHOS: 87 U/L (ref 39–117)
ALT: 26 U/L (ref 0–35)
AST: 36 U/L (ref 0–37)
BUN: 12 mg/dL (ref 6–23)
CO2: 24 meq/L (ref 19–32)
Calcium: 9.6 mg/dL (ref 8.4–10.5)
Chloride: 105 mEq/L (ref 96–112)
Creatinine, Ser: 0.65 mg/dL (ref 0.40–1.20)
GFR: 122 mL/min (ref >60.00)
GLUCOSE: 87 mg/dL (ref 70–99)
POTASSIUM: 4.1 meq/L (ref 3.5–5.1)
SODIUM: 138 meq/L (ref 135–145)
TOTAL PROTEIN: 8.6 g/dL — AB (ref 6.0–8.3)
Total Bilirubin: 0.5 mg/dL (ref 0.2–1.2)

## 2014-08-18 LAB — POCT URINALYSIS DIPSTICK
Bilirubin, UA: NEGATIVE
Glucose, UA: NEGATIVE
KETONES UA: NEGATIVE
LEUKOCYTES UA: NEGATIVE
NITRITE UA: NEGATIVE
PH UA: 6
PROTEIN UA: NEGATIVE
Spec Grav, UA: 1.02
UROBILINOGEN UA: 0.2

## 2014-08-18 LAB — LIPID PANEL
CHOLESTEROL: 183 mg/dL (ref 0–200)
HDL: 50.4 mg/dL (ref >39.00)
LDL Cholesterol: 106 mg/dL — ABNORMAL HIGH (ref 0–99)
NonHDL: 132.6
TRIGLYCERIDES: 135 mg/dL (ref 0.0–149.0)
Total CHOL/HDL Ratio: 4
VLDL: 27 mg/dL (ref 0.0–40.0)

## 2014-08-18 LAB — CBC WITH DIFFERENTIAL/PLATELET
BASOS PCT: 0.6 % (ref 0.0–3.0)
Basophils Absolute: 0 10*3/uL (ref 0.0–0.1)
EOS PCT: 1.5 % (ref 0.0–5.0)
Eosinophils Absolute: 0.1 10*3/uL (ref 0.0–0.7)
HEMATOCRIT: 43.5 % (ref 36.0–46.0)
Hemoglobin: 14.4 g/dL (ref 12.0–15.0)
LYMPHS ABS: 2.6 10*3/uL (ref 0.7–4.0)
Lymphocytes Relative: 36.5 % (ref 12.0–46.0)
MCHC: 33.1 g/dL (ref 30.0–36.0)
MCV: 99.2 fl (ref 78.0–100.0)
MONO ABS: 0.6 10*3/uL (ref 0.1–1.0)
Monocytes Relative: 7.8 % (ref 3.0–12.0)
Neutro Abs: 3.9 10*3/uL (ref 1.4–7.7)
Neutrophils Relative %: 53.6 % (ref 43.0–77.0)
PLATELETS: 223 10*3/uL (ref 150.0–400.0)
RBC: 4.39 Mil/uL (ref 3.87–5.11)
RDW: 14 % (ref 11.5–15.5)
WBC: 7.2 10*3/uL (ref 4.0–10.5)

## 2014-08-18 LAB — TSH: TSH: 2.58 u[IU]/mL (ref 0.35–4.50)

## 2014-08-25 ENCOUNTER — Encounter: Payer: Self-pay | Admitting: Family Medicine

## 2014-08-25 ENCOUNTER — Ambulatory Visit (INDEPENDENT_AMBULATORY_CARE_PROVIDER_SITE_OTHER): Payer: Medicare Other | Admitting: Family Medicine

## 2014-08-25 ENCOUNTER — Other Ambulatory Visit (HOSPITAL_COMMUNITY)
Admission: RE | Admit: 2014-08-25 | Discharge: 2014-08-25 | Disposition: A | Discharge status: Home / Self Care | Payer: Medicare Other | Source: Ambulatory Visit | Attending: Family Medicine | Admitting: Family Medicine

## 2014-08-25 VITALS — BP 121/81 | HR 91 | Temp 98.4°F | Ht 65.75 in | Wt 199.0 lb

## 2014-08-25 DIAGNOSIS — Z23 Encounter for immunization: Secondary | ICD-10-CM | POA: Diagnosis not present

## 2014-08-25 DIAGNOSIS — Z01419 Encounter for gynecological examination (general) (routine) without abnormal findings: Secondary | ICD-10-CM | POA: Diagnosis present

## 2014-08-25 DIAGNOSIS — Z0000 Encounter for general adult medical examination without abnormal findings: Secondary | ICD-10-CM | POA: Diagnosis not present

## 2014-08-25 MED ORDER — HYDROCODONE-ACETAMINOPHEN 5-325 MG PO TABS: 1.0000 | ORAL_TABLET | Freq: Four times a day (QID) | ORAL | Status: DC | PRN

## 2014-08-25 NOTE — Addendum Note (Signed)
Addended by: Aggie Hacker A on: 08/25/2014 12:05 PM   Modules accepted: Orders

## 2014-08-25 NOTE — Progress Notes (Signed)
Subjective:    Patient ID: Stephanie Schmidt, female    DOB: 10/21/1960, 54 y.o.   MRN: [id number]  HPI 54 yr old female for a cpx. She feels well. She is about to do a 21 day "cleansing" with a clear liquid diet only and then she will start a diet and exercise plan to lose weight.    Review of Systems  Constitutional: Negative.  Negative for fever, diaphoresis, activity change, appetite change, fatigue and unexpected weight change.  HENT: Negative.  Negative for congestion, ear pain, hearing loss, nosebleeds, sore throat, tinnitus, trouble swallowing and voice change.   Eyes: Negative.  Negative for photophobia, pain, discharge, redness and visual disturbance.  Respiratory: Negative.  Negative for apnea, cough, choking, chest tightness, shortness of breath, wheezing and stridor.   Cardiovascular: Negative.  Negative for chest pain, palpitations and leg swelling.  Gastrointestinal: Negative.  Negative for nausea, vomiting, abdominal pain, diarrhea, constipation, blood in stool, abdominal distention and rectal pain.  Genitourinary: Negative.  Negative for dysuria, urgency, frequency, hematuria, flank pain, vaginal bleeding, vaginal discharge, enuresis, difficulty urinating, vaginal pain and menstrual problem.  Musculoskeletal: Negative.  Negative for myalgias, back pain, joint swelling, arthralgias, gait problem, neck pain and neck stiffness.  Skin: Negative.  Negative for color change, pallor, rash and wound.  Neurological: Negative.  Negative for dizziness, tremors, seizures, syncope, speech difficulty, weakness, light-headedness, numbness and headaches.  Hematological: Negative for adenopathy. Does not bruise/bleed easily.  Psychiatric/Behavioral: Negative.  Negative for hallucinations, behavioral problems, confusion, sleep disturbance, dysphoric mood and agitation. The patient is not nervous/anxious.        Objective:   Physical Exam  Constitutional: She appears well-developed and  well-nourished. No distress.  HENT:  Head: Normocephalic and atraumatic.  Right Ear: External ear normal.  Left Ear: External ear normal.  Nose: Nose normal.  Mouth/Throat: Oropharynx is clear and moist. No oropharyngeal exudate.  Eyes: Conjunctivae and EOM are normal. Pupils are equal, round, and reactive to light. Right eye exhibits no discharge. Left eye exhibits no discharge. No scleral icterus.  Neck: Normal range of motion. Neck supple. No JVD present. No thyromegaly present.  Cardiovascular: Normal rate, regular rhythm, normal heart sounds and intact distal pulses.  Exam reveals no gallop and no friction rub.   No murmur heard. EKG normal   Pulmonary/Chest: Effort normal and breath sounds normal. No stridor. No respiratory distress. She has no wheezes. She has no rales. She exhibits no tenderness.  Abdominal: Soft. Normal appearance and bowel sounds are normal. She exhibits no distension, no abdominal bruit, no ascites and no mass. There is no hepatosplenomegaly. There is no tenderness. There is no rigidity, no rebound and no guarding. No hernia.  Genitourinary: Rectum normal, vagina normal and uterus normal. No breast swelling, tenderness, discharge or bleeding. Cervix exhibits no motion tenderness, no discharge and no friability. Right adnexum displays no mass, no tenderness and no fullness. Left adnexum displays no mass, no tenderness and no fullness. No erythema, tenderness or bleeding in the vagina. No vaginal discharge found.  Musculoskeletal: Normal range of motion. She exhibits no edema or tenderness.  Lymphadenopathy:    She has no cervical adenopathy.  Neurological: She is alert. She has normal reflexes. No cranial nerve deficit. She exhibits normal muscle tone. Coordination normal.  Skin: Skin is warm and dry. No rash noted. She is not diaphoretic. No erythema. No pallor.  Psychiatric: She has a normal mood and affect. Her behavior is normal. Judgment and thought content normal.  Assessment & Plan:  Well exam.

## 2014-08-27 LAB — CYTOLOGY - PAP

## 2015-04-29 ENCOUNTER — Telehealth: Payer: Self-pay | Admitting: Family Medicine

## 2015-04-29 MED ORDER — HYDROCODONE-ACETAMINOPHEN 5-325 MG PO TABS: 1.0000 | ORAL_TABLET | Freq: Four times a day (QID) | ORAL | Status: DC | PRN

## 2015-04-29 NOTE — Telephone Encounter (Signed)
Script is ready for pick up and I spoke with pt.  

## 2015-04-29 NOTE — Telephone Encounter (Signed)
done

## 2015-04-29 NOTE — Telephone Encounter (Signed)
Pt request refill of the following: HYDROcodone-acetaminophen (NORCO/VICODIN) 5-325 MG per tablet ° ° °Phamacy: °

## 2015-08-17 DIAGNOSIS — M1712 Unilateral primary osteoarthritis, left knee: Secondary | ICD-10-CM | POA: Diagnosis not present

## 2015-10-25 ENCOUNTER — Telehealth: Payer: Self-pay | Admitting: Family Medicine

## 2015-10-25 NOTE — Telephone Encounter (Signed)
Pt states she has fever, possible sinus infection with pressure, chills. Pt wanted appoinment today. However, only same day appointments on Wed. Is it ok to use one of the Wednesday SD's?

## 2015-10-25 NOTE — Telephone Encounter (Signed)
Pt has been scheduled for wed.

## 2015-10-25 NOTE — Telephone Encounter (Signed)
Okay to schedule per Dr Sarajane Jews

## 2015-10-26 ENCOUNTER — Ambulatory Visit (INDEPENDENT_AMBULATORY_CARE_PROVIDER_SITE_OTHER): Payer: PPO | Admitting: Family Medicine

## 2015-10-26 ENCOUNTER — Encounter: Payer: Self-pay | Admitting: Family Medicine

## 2015-10-26 VITALS — BP 113/83 | HR 94 | Temp 98.6°F | Wt 197.5 lb

## 2015-10-26 DIAGNOSIS — J019 Acute sinusitis, unspecified: Secondary | ICD-10-CM

## 2015-10-26 MED ORDER — HYDROCODONE-ACETAMINOPHEN 5-325 MG PO TABS: 1.0000 | ORAL_TABLET | Freq: Four times a day (QID) | ORAL | Status: DC | PRN

## 2015-10-26 MED ORDER — IBUPROFEN 800 MG PO TABS: 800.0000 mg | ORAL_TABLET | Freq: Three times a day (TID) | ORAL | Status: DC | PRN

## 2015-10-26 MED ORDER — AZITHROMYCIN 250 MG PO TABS: ORAL_TABLET | ORAL | Status: DC

## 2015-10-26 NOTE — Progress Notes (Signed)
Pre visit review using our clinic review tool, if applicable. No additional management support is needed unless otherwise documented below in the visit note. 

## 2015-10-26 NOTE — Progress Notes (Signed)
   Subjective:    Patient ID: Stephanie Schmidt, female    DOB: 1960-09-12, 55 y.o.   MRN: [id number]  HPI Here for one week of sinus pressure, PND, and blowing yellow mucus from the nose. No fever. On Mucinex.    Review of Systems  Constitutional: Negative.   HENT: Positive for congestion, postnasal drip and sinus pressure. Negative for sore throat.   Eyes: Negative.   Respiratory: Positive for cough.        Objective:   Physical Exam  Constitutional: She appears well-developed and well-nourished.  HENT:  Right Ear: External ear normal.  Left Ear: External ear normal.  Nose: Nose normal.  Mouth/Throat: Oropharynx is clear and moist.  Eyes: Conjunctivae are normal.  Neck: No thyromegaly present.  Pulmonary/Chest: Effort normal and breath sounds normal.  Lymphadenopathy:    She has no cervical adenopathy.          Assessment & Plan:  Sinsuitis, treat with a Zpack

## 2015-11-24 ENCOUNTER — Other Ambulatory Visit (INDEPENDENT_AMBULATORY_CARE_PROVIDER_SITE_OTHER): Payer: PPO

## 2015-11-24 ENCOUNTER — Other Ambulatory Visit: Payer: Self-pay

## 2015-11-24 DIAGNOSIS — Z0000 Encounter for general adult medical examination without abnormal findings: Secondary | ICD-10-CM

## 2015-11-24 DIAGNOSIS — Z1231 Encounter for screening mammogram for malignant neoplasm of breast: Secondary | ICD-10-CM

## 2015-11-24 LAB — CBC WITH DIFFERENTIAL/PLATELET
BASOS PCT: 0.4 % (ref 0.0–3.0)
Basophils Absolute: 0 10*3/uL (ref 0.0–0.1)
EOS PCT: 3.1 % (ref 0.0–5.0)
Eosinophils Absolute: 0.2 10*3/uL (ref 0.0–0.7)
HEMATOCRIT: 42.1 % (ref 36.0–46.0)
HEMOGLOBIN: 14.5 g/dL (ref 12.0–15.0)
LYMPHS PCT: 31.5 % (ref 12.0–46.0)
Lymphs Abs: 2.2 10*3/uL (ref 0.7–4.0)
MCHC: 34.5 g/dL (ref 30.0–36.0)
MCV: 95.3 fl (ref 78.0–100.0)
MONO ABS: 0.5 10*3/uL (ref 0.1–1.0)
MONOS PCT: 7.1 % (ref 3.0–12.0)
Neutro Abs: 4.1 10*3/uL (ref 1.4–7.7)
Neutrophils Relative %: 57.9 % (ref 43.0–77.0)
Platelets: 280 10*3/uL (ref 150.0–400.0)
RBC: 4.42 Mil/uL (ref 3.87–5.11)
RDW: 13.3 % (ref 11.5–15.5)
WBC: 7 10*3/uL (ref 4.0–10.5)

## 2015-11-24 LAB — POC URINALSYSI DIPSTICK (AUTOMATED)
BILIRUBIN UA: NEGATIVE
GLUCOSE UA: NEGATIVE
Ketones, UA: NEGATIVE
LEUKOCYTES UA: NEGATIVE
NITRITE UA: NEGATIVE
Protein, UA: NEGATIVE
Spec Grav, UA: >=1.03
Urobilinogen, UA: 0.2
pH, UA: 5

## 2015-11-24 LAB — BASIC METABOLIC PANEL
BUN: 10 mg/dL (ref 6–23)
CHLORIDE: 103 meq/L (ref 96–112)
CO2: 27 mEq/L (ref 19–32)
Calcium: 10 mg/dL (ref 8.4–10.5)
Creatinine, Ser: 0.73 mg/dL (ref 0.40–1.20)
GFR: 106.21 mL/min (ref >60.00)
Glucose, Bld: 91 mg/dL (ref 70–99)
POTASSIUM: 4.3 meq/L (ref 3.5–5.1)
SODIUM: 136 meq/L (ref 135–145)

## 2015-11-24 LAB — LIPID PANEL
Cholesterol: 218 mg/dL — ABNORMAL HIGH (ref 0–200)
HDL: 55.1 mg/dL (ref >39.00)
LDL Cholesterol: 133 mg/dL — ABNORMAL HIGH (ref 0–99)
NONHDL: 162.91
Total CHOL/HDL Ratio: 4
Triglycerides: 150 mg/dL — ABNORMAL HIGH (ref 0.0–149.0)
VLDL: 30 mg/dL (ref 0.0–40.0)

## 2015-11-24 LAB — HEPATIC FUNCTION PANEL
ALBUMIN: 4.1 g/dL (ref 3.5–5.2)
ALT: 22 U/L (ref 0–35)
AST: 24 U/L (ref 0–37)
Alkaline Phosphatase: 92 U/L (ref 39–117)
Bilirubin, Direct: 0.1 mg/dL (ref 0.0–0.3)
TOTAL PROTEIN: 8.1 g/dL (ref 6.0–8.3)
Total Bilirubin: 0.6 mg/dL (ref 0.2–1.2)

## 2015-11-24 LAB — TSH: TSH: 2.57 u[IU]/mL (ref 0.35–4.50)

## 2015-11-30 ENCOUNTER — Encounter: Payer: Self-pay | Admitting: Family Medicine

## 2015-11-30 ENCOUNTER — Ambulatory Visit (INDEPENDENT_AMBULATORY_CARE_PROVIDER_SITE_OTHER): Payer: PPO | Admitting: Family Medicine

## 2015-11-30 VITALS — BP 103/79 | HR 82 | Temp 98.3°F | Ht 65.75 in | Wt 206.0 lb

## 2015-11-30 DIAGNOSIS — Z0000 Encounter for general adult medical examination without abnormal findings: Secondary | ICD-10-CM | POA: Diagnosis not present

## 2015-11-30 DIAGNOSIS — R319 Hematuria, unspecified: Secondary | ICD-10-CM

## 2015-11-30 DIAGNOSIS — E785 Hyperlipidemia, unspecified: Secondary | ICD-10-CM

## 2015-11-30 NOTE — Progress Notes (Signed)
Pre visit review using our clinic review tool, if applicable. No additional management support is needed unless otherwise documented below in the visit note. 

## 2015-11-30 NOTE — Progress Notes (Signed)
   Subjective:    Patient ID: Stephanie Schmidt, female    DOB: [birth year], 55 y.o.   MRN: [id number]  HPI 55 yr old female for a cpx. She feels well. She admits to eating a poor diet lately and this has resulted in her lipid levels going up.    Review of Systems  Constitutional: Negative.   HENT: Negative.   Eyes: Negative.   Respiratory: Negative.   Cardiovascular: Negative.   Gastrointestinal: Negative.   Genitourinary: Negative for dysuria, urgency, frequency, hematuria, flank pain, decreased urine volume, enuresis, difficulty urinating, pelvic pain and dyspareunia.  Musculoskeletal: Negative.   Skin: Negative.   Neurological: Negative.   Psychiatric/Behavioral: Negative.        Objective:   Physical Exam  Constitutional: She is oriented to person, place, and time. She appears well-developed and well-nourished. No distress.  HENT:  Head: Normocephalic and atraumatic.  Right Ear: External ear normal.  Left Ear: External ear normal.  Nose: Nose normal.  Mouth/Throat: Oropharynx is clear and moist. No oropharyngeal exudate.  Eyes: Conjunctivae and EOM are normal. Pupils are equal, round, and reactive to light. No scleral icterus.  Neck: Normal range of motion. Neck supple. No JVD present. No thyromegaly present.  Cardiovascular: Normal rate, regular rhythm, normal heart sounds and intact distal pulses.  Exam reveals no gallop and no friction rub.   No murmur heard. EKG normal   Pulmonary/Chest: Effort normal and breath sounds normal. No respiratory distress. She has no wheezes. She has no rales. She exhibits no tenderness.  Abdominal: Soft. Bowel sounds are normal. She exhibits no distension and no mass. There is no tenderness. There is no rebound and no guarding.  Musculoskeletal: Normal range of motion. She exhibits no edema or tenderness.  Lymphadenopathy:    She has no cervical adenopathy.  Neurological: She is alert and oriented to person, place, and time. She has normal  reflexes. No cranial nerve deficit. She exhibits normal muscle tone. Coordination normal.  Skin: Skin is warm and dry. No rash noted. No erythema.  Psychiatric: She has a normal mood and affect. Her behavior is normal. Judgment and thought content normal.          Assessment & Plan:  Well exam. We discussed diet and exercise. She has some blood in the urine and she is post-menopausal, so we will refer to Urology for this. Set up for another colonoscopy this summer.  Laurey Morale, MD

## 2015-12-15 ENCOUNTER — Ambulatory Visit
Admission: RE | Admit: 2015-12-15 | Discharge: 2015-12-15 | Disposition: A | Discharge status: Home / Self Care | Payer: PPO | Source: Ambulatory Visit

## 2015-12-15 DIAGNOSIS — Z1231 Encounter for screening mammogram for malignant neoplasm of breast: Secondary | ICD-10-CM

## 2016-01-24 ENCOUNTER — Encounter: Payer: Self-pay | Admitting: Gastroenterology

## 2016-02-02 ENCOUNTER — Telehealth: Payer: Self-pay | Admitting: Family Medicine

## 2016-02-02 NOTE — Telephone Encounter (Signed)
Pt need new Rx for Hydrocodone and ibuprofen

## 2016-02-03 ENCOUNTER — Telehealth: Payer: Self-pay | Admitting: Family Medicine

## 2016-02-03 MED ORDER — IBUPROFEN 800 MG PO TABS: 800.0000 mg | ORAL_TABLET | Freq: Three times a day (TID) | ORAL | Status: DC | PRN

## 2016-02-03 MED ORDER — HYDROCODONE-ACETAMINOPHEN 5-325 MG PO TABS: 1.0000 | ORAL_TABLET | Freq: Four times a day (QID) | ORAL | Status: DC | PRN

## 2016-02-03 NOTE — Telephone Encounter (Signed)
done

## 2016-02-03 NOTE — Telephone Encounter (Signed)
This is a duplicate request, see previous note. 

## 2016-02-03 NOTE — Telephone Encounter (Signed)
Pt calling to check on Rx

## 2016-02-03 NOTE — Addendum Note (Signed)
Addended by: Alysia Penna A on: 02/03/2016 04:45 PM   Modules accepted: Orders

## 2016-02-06 NOTE — Telephone Encounter (Signed)
Script is ready for pick up, tried to reach pt and no answer.  

## 2016-03-15 ENCOUNTER — Ambulatory Visit (AMBULATORY_SURGERY_CENTER): Payer: Self-pay | Admitting: *Deleted

## 2016-03-15 VITALS — Ht 66.0 in | Wt 202.0 lb

## 2016-03-15 DIAGNOSIS — Z86010 Personal history of colonic polyps: Secondary | ICD-10-CM

## 2016-03-15 MED ORDER — NA SULFATE-K SULFATE-MG SULF 17.5-3.13-1.6 GM/177ML PO SOLN: ORAL | 0 refills | Status: DC

## 2016-03-15 NOTE — Progress Notes (Signed)
Patient denies any allergies to eggs or soy. Patient denies any problems with anesthesia/sedation. Patient denies any oxygen use at home and does not take any diet/weight loss medications.  

## 2016-03-16 ENCOUNTER — Encounter: Payer: Self-pay | Admitting: Gastroenterology

## 2016-03-27 ENCOUNTER — Telehealth: Payer: Self-pay | Admitting: Gastroenterology

## 2016-03-28 NOTE — Telephone Encounter (Signed)
Have a sample kit for patient

## 2016-03-28 NOTE — Telephone Encounter (Signed)
Patient aware to come get sample kit. Will be here today

## 2016-03-28 NOTE — Telephone Encounter (Signed)
Pt is calling back about her Prep.

## 2016-03-29 ENCOUNTER — Encounter: Payer: Self-pay | Admitting: Gastroenterology

## 2016-03-29 ENCOUNTER — Ambulatory Visit (AMBULATORY_SURGERY_CENTER): Payer: PPO | Admitting: Gastroenterology

## 2016-03-29 VITALS — BP 110/60 | HR 70 | Temp 97.3°F | Resp 14 | Ht 66.0 in | Wt 202.0 lb

## 2016-03-29 DIAGNOSIS — K621 Rectal polyp: Secondary | ICD-10-CM | POA: Diagnosis not present

## 2016-03-29 DIAGNOSIS — D127 Benign neoplasm of rectosigmoid junction: Secondary | ICD-10-CM | POA: Diagnosis not present

## 2016-03-29 DIAGNOSIS — K635 Polyp of colon: Secondary | ICD-10-CM

## 2016-03-29 DIAGNOSIS — D125 Benign neoplasm of sigmoid colon: Secondary | ICD-10-CM

## 2016-03-29 DIAGNOSIS — Z86010 Personal history of colonic polyps: Secondary | ICD-10-CM

## 2016-03-29 DIAGNOSIS — D869 Sarcoidosis, unspecified: Secondary | ICD-10-CM | POA: Diagnosis not present

## 2016-03-29 DIAGNOSIS — M329 Systemic lupus erythematosus, unspecified: Secondary | ICD-10-CM | POA: Diagnosis not present

## 2016-03-29 DIAGNOSIS — D128 Benign neoplasm of rectum: Secondary | ICD-10-CM

## 2016-03-29 HISTORY — PX: COLONOSCOPY: SHX174

## 2016-03-29 MED ORDER — SODIUM CHLORIDE 0.9 % IV SOLN: 500.0000 mL | INTRAVENOUS | Status: DC

## 2016-03-29 NOTE — Progress Notes (Signed)
Report to PACU, RN, vss, BBS= Clear.  

## 2016-03-29 NOTE — Progress Notes (Signed)
Called to room to assist during endoscopic procedure.  Patient ID and intended procedure confirmed with present staff. Received instructions for my participation in the procedure from the performing physician.  
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
## 2016-03-29 NOTE — Patient Instructions (Signed)
Colon polyps x 3  removed today. Handouts given on polyps, hemorrhoids. Result letter in your mail in 2-3 weeks.  Resume current medications. Call us with any questions or concerns. Thank you!  YOU HAD AN ENDOSCOPIC PROCEDURE TODAY AT [facility]:   Refer to the procedure report that was given to you for any specific questions about what was found during the examination.  If the procedure report does not answer your questions, please call your gastroenterologist to clarify.  If you requested that your care partner not be given the details of your procedure findings, then the procedure report has been included in a sealed envelope for you to review at your convenience later.  YOU SHOULD EXPECT: Some feelings of bloating in the abdomen. Passage of more gas than usual.  Walking can help get rid of the air that was put into your GI tract during the procedure and reduce the bloating. If you had a lower endoscopy (such as a colonoscopy or flexible sigmoidoscopy) you may notice spotting of blood in your stool or on the toilet paper. If you underwent a bowel prep for your procedure, you may not have a normal bowel movement for a few days.  Please Note:  You might notice some irritation and congestion in your nose or some drainage.  This is from the oxygen used during your procedure.  There is no need for concern and it should clear up in a day or so.  SYMPTOMS TO REPORT IMMEDIATELY:   Following lower endoscopy (colonoscopy or flexible sigmoidoscopy):  Excessive amounts of blood in the stool  Significant tenderness or worsening of abdominal pains  Swelling of the abdomen that is new, acute  Fever of 100F or higher   For urgent or emergent issues, a gastroenterologist can be reached at any hour by calling [phone].   DIET:  We do recommend a small meal at first, but then you may proceed to your regular diet.  Drink plenty of fluids but you should avoid alcoholic beverages for 24  hours.  ACTIVITY:  You should plan to take it easy for the rest of today and you should NOT DRIVE or use heavy machinery until tomorrow (because of the sedation medicines used during the test).    FOLLOW UP: Our staff will call the number listed on your records the next business day following your procedure to check on you and address any questions or concerns that you may have regarding the information given to you following your procedure. If we do not reach you, we will leave a message.  However, if you are feeling well and you are not experiencing any problems, there is no need to return our call.  We will assume that you have returned to your regular daily activities without incident.  If any biopsies were taken you will be contacted by phone or by letter within the next 1-3 weeks.  Please call us at [phone] if you have not heard about the biopsies in 3 weeks.    SIGNATURES/CONFIDENTIALITY: You and/or your care partner have signed paperwork which will be entered into your electronic medical record.  These signatures attest to the fact that that the information above on your After Visit Summary has been reviewed and is understood.  Full responsibility of the confidentiality of this discharge information lies with you and/or your care-partner.

## 2016-03-29 NOTE — Op Note (Signed)
[name] Procedure Date: 03/29/2016 9:11 AM MRN: [id number] Endoscopist: Mauri Pole , MD Age: 55 Referring MD:  Date of Birth: 1961/07/19 Gender: Female Account #: [id number] Procedure:                Colonoscopy Indications:              Surveillance: Personal history of adenomatous                            polyps on last colonoscopy 5 years ago Medicines:                Monitored Anesthesia Care Procedure:                Pre-Anesthesia Assessment:                           - Prior to the procedure, a History and Physical                            was performed, and patient medications and                            allergies were reviewed. The patient's tolerance of                            previous anesthesia was also reviewed. The risks                            and benefits of the procedure and the sedation                            options and risks were discussed with the patient.                            All questions were answered, and informed consent                            was obtained. Prior Anticoagulants: The patient has                            taken no previous anticoagulant or antiplatelet                            agents. ASA Grade Assessment: II - A patient with                            mild systemic disease. After reviewing the risks                            and benefits, the patient was deemed in                            satisfactory condition to undergo the procedure.  After obtaining informed consent, the colonoscope                            was passed under direct vision. Throughout the                            procedure, the patient's blood pressure, pulse, and                            oxygen saturations were monitored continuously. The                            Model CF-HQ190L [id number]) scope was introduced                            through the anus  and advanced to the the cecum,                            identified by appendiceal orifice and ileocecal                            valve. The colonoscopy was performed without                            difficulty. The patient tolerated the procedure                            well. The quality of the bowel preparation was                            good. The terminal ileum, ileocecal valve,                            appendiceal orifice, and rectum were photographed. Scope In: 9:17:17 AM Scope Out: 9:32:19 AM Scope Withdrawal Time: 0 hours 11 minutes 2 seconds  Total Procedure Duration: 0 hours 15 minutes 2 seconds  Findings:                 The perianal and digital rectal examinations were                            normal.                           Three sessile polyps were found in the                            recto-sigmoid colon. The polyps were 5 to 7 mm in                            size. These polyps were removed with a cold snare.                            Resection and retrieval were complete.  Non-bleeding internal hemorrhoids were found during                            retroflexion. The hemorrhoids were small.                           The exam was otherwise without abnormality. Complications:            No immediate complications. Estimated Blood Loss:     Estimated blood loss was minimal. Impression:               - Three 5 to 7 mm polyps at the recto-sigmoid                            colon, removed with a cold snare. Resected and                            retrieved.                           - Non-bleeding internal hemorrhoids.                           - The examination was otherwise normal. Recommendation:           - Patient has a contact number available for                            emergencies. The signs and symptoms of potential                            delayed complications were discussed with the                             patient. Return to normal activities tomorrow.                            Written discharge instructions were provided to the                            patient.                           - Resume previous diet.                           - Continue present medications.                           - Await pathology results.                           - Repeat colonoscopy in 5-10 years for surveillance                            based on pathology results. Mauri Pole, MD 03/29/2016 9:39:55 AM This report has been signed electronically.

## 2016-03-30 ENCOUNTER — Telehealth: Payer: Self-pay

## 2016-03-30 NOTE — Telephone Encounter (Signed)
  Follow up Call-  Call back number 03/29/2016  Post procedure Call Back phone  # [phone]  Permission to leave phone message Yes  Some recent data might be hidden     Patient questions:  Do you have a fever, pain , or abdominal swelling? No. Pain Score  0 *  Have you tolerated food without any problems? Yes.    Have you been able to return to your normal activities? Yes.    Do you have any questions about your discharge instructions: Diet   No. Medications  No. Follow up visit  No.  Do you have questions or concerns about your Care? No.  Actions: * If pain score is 4 or above: No action needed, pain <4.

## 2016-04-08 ENCOUNTER — Encounter: Payer: Self-pay | Admitting: Gastroenterology

## 2016-08-17 ENCOUNTER — Telehealth: Payer: Self-pay | Admitting: Family Medicine

## 2016-08-17 NOTE — Telephone Encounter (Signed)
Pt needs new rx on hydrocodone and oxycodone

## 2016-08-19 NOTE — Telephone Encounter (Signed)
She will need an OV for any refills

## 2016-08-20 NOTE — Telephone Encounter (Signed)
Pt has been sch

## 2016-08-21 ENCOUNTER — Ambulatory Visit (INDEPENDENT_AMBULATORY_CARE_PROVIDER_SITE_OTHER): Payer: PPO | Admitting: Family Medicine

## 2016-08-21 ENCOUNTER — Encounter: Payer: Self-pay | Admitting: Family Medicine

## 2016-08-21 VITALS — BP 123/83 | HR 80 | Temp 98.1°F | Ht 66.0 in | Wt 199.0 lb

## 2016-08-21 DIAGNOSIS — G8929 Other chronic pain: Secondary | ICD-10-CM | POA: Insufficient documentation

## 2016-08-21 DIAGNOSIS — M545 Low back pain, unspecified: Secondary | ICD-10-CM

## 2016-08-21 DIAGNOSIS — R1013 Epigastric pain: Secondary | ICD-10-CM

## 2016-08-21 MED ORDER — HYDROCODONE-ACETAMINOPHEN 5-325 MG PO TABS: 1.0000 | ORAL_TABLET | Freq: Four times a day (QID) | ORAL | 0 refills | Status: DC | PRN

## 2016-08-21 MED ORDER — MELOXICAM 15 MG PO TABS
15.0000 mg | ORAL_TABLET | ORAL | 3 refills | Status: DC | PRN
Start: 2016-08-21 — End: 2018-04-25

## 2016-08-21 NOTE — Progress Notes (Signed)
   Subjective:    Patient ID: Stephanie Schmidt, female    DOB: 04-20-1961, 56 y.o.   MRN: [id number]  HPI Here for med refills and to ask some questions. She had a hiatal hernia repair in 2003 and she thinks this may be causing problems again. For the past year she has developed frequent upper abdominal pains and a sense of "churning" in the abdomen that sometimes wakes her up at night. Her BMs are normal. She continues to have intermittent low back pain and she takes Ibuprofen and occasional hydrocodone for this.    Review of Systems  Constitutional: Negative.   Respiratory: Negative.   Cardiovascular: Negative.   Gastrointestinal: Positive for abdominal pain and nausea. Negative for abdominal distention, anal bleeding, blood in stool, constipation, diarrhea, rectal pain and vomiting.  Musculoskeletal: Positive for back pain.  Neurological: Negative.        Objective:   Physical Exam  Constitutional: She is oriented to person, place, and time. She appears well-developed and well-nourished.  Cardiovascular: Normal rate, regular rhythm, normal heart sounds and intact distal pulses.   Pulmonary/Chest: Effort normal and breath sounds normal.  Abdominal: Soft. Bowel sounds are normal. She exhibits no distension and no mass. There is no tenderness. There is no rebound and no guarding.  Musculoskeletal: Normal range of motion. She exhibits no edema or tenderness.  Neurological: She is alert and oriented to person, place, and time.          Assessment & Plan:  For the back pain, she will get back on Meloxicam daily. Use Norco for more severe pains. I will refer her to see Dr. Lucia Gaskins again for the abdominal pain.  Alysia Penna, MD

## 2016-08-21 NOTE — Progress Notes (Signed)
Pre visit review using our clinic review tool, if applicable. No additional management support is needed unless otherwise documented below in the visit note. 

## 2016-08-31 ENCOUNTER — Telehealth: Payer: Self-pay | Admitting: Family Medicine

## 2016-08-31 DIAGNOSIS — R319 Hematuria, unspecified: Secondary | ICD-10-CM

## 2016-08-31 MED ORDER — TRAMADOL HCL 50 MG PO TABS: 50.0000 mg | ORAL_TABLET | Freq: Four times a day (QID) | ORAL | 2 refills | Status: DC | PRN

## 2016-08-31 NOTE — Telephone Encounter (Addendum)
Pt is allergic to hydrocodone and would like something else. Pt left rx at pharm. Pt also would like a referral to [facility]. Pt saw urologist there year ago. Pt can not remember the name of md . Pt was last seen on 08-21-16

## 2016-08-31 NOTE — Telephone Encounter (Signed)
I called in script to [facility], added Hydrocodone to drug allergy list, tried to reach pt on both cell # and home #, no answer or option to leave a voice message.

## 2016-08-31 NOTE — Telephone Encounter (Signed)
Call in Tramadol 50 mg to take 1-2 every 6 hours prn pain, #60 with 2 rf. The Urology referral was done

## 2016-09-04 NOTE — Telephone Encounter (Signed)
I left a message with mom for pt to return my call, we need a valid contact number for pt as well.

## 2016-09-04 NOTE — Telephone Encounter (Signed)
I spoke with pt and went over below information. 

## 2016-09-12 DIAGNOSIS — R1013 Epigastric pain: Secondary | ICD-10-CM | POA: Diagnosis not present

## 2016-09-13 ENCOUNTER — Ambulatory Visit (INDEPENDENT_AMBULATORY_CARE_PROVIDER_SITE_OTHER): Payer: PPO | Admitting: Family Medicine

## 2016-09-13 ENCOUNTER — Encounter: Payer: Self-pay | Admitting: Family Medicine

## 2016-09-13 VITALS — BP 102/79 | HR 77 | Temp 97.9°F | Ht 66.0 in | Wt 193.0 lb

## 2016-09-13 DIAGNOSIS — J018 Other acute sinusitis: Secondary | ICD-10-CM

## 2016-09-13 MED ORDER — LEVOFLOXACIN 500 MG PO TABS: 500.0000 mg | ORAL_TABLET | Freq: Every day | ORAL | 0 refills | Status: AC

## 2016-09-13 NOTE — Progress Notes (Signed)
Pre visit review using our clinic review tool, if applicable. No additional management support is needed unless otherwise documented below in the visit note. 

## 2016-09-13 NOTE — Progress Notes (Signed)
   Subjective:    Patient ID: Stephanie Schmidt, female    DOB: 22-Nov-1960, 56 y.o.   MRN: [id number]  HPI Here for 2 weeks of sinus pressure, ear pain, PND, and a dry cough. No fever. Using Sudafed and Mucinex. She saw Dr. Lucia Gaskins yesterday about her abdominal pain and he is setting her up for a CT scan soon.    Review of Systems  Constitutional: Negative.   HENT: Positive for congestion, ear pain, postnasal drip, sinus pain and sinus pressure. Negative for sore throat.   Eyes: Negative.   Respiratory: Positive for cough.        Objective:   Physical Exam  Constitutional: She appears well-developed and well-nourished. No distress.  HENT:  Right Ear: External ear normal.  Left Ear: External ear normal.  Nose: Nose normal.  Mouth/Throat: Oropharynx is clear and moist.  Eyes: Conjunctivae are normal.  Neck: No thyromegaly present.  Pulmonary/Chest: Effort normal and breath sounds normal.  Lymphadenopathy:    She has no cervical adenopathy.          Assessment & Plan:  Sinusitis, treat with Levaquin.  Alysia Penna, MD

## 2016-09-24 ENCOUNTER — Other Ambulatory Visit: Payer: Self-pay | Admitting: Surgery

## 2016-09-24 DIAGNOSIS — R1013 Epigastric pain: Secondary | ICD-10-CM

## 2016-09-26 ENCOUNTER — Other Ambulatory Visit: Payer: Self-pay

## 2016-09-27 ENCOUNTER — Other Ambulatory Visit: Payer: Self-pay

## 2016-09-28 ENCOUNTER — Ambulatory Visit
Admission: RE | Admit: 2016-09-28 | Discharge: 2016-09-28 | Disposition: A | Discharge status: Home / Self Care | Payer: PPO | Source: Ambulatory Visit | Attending: Surgery | Admitting: Surgery

## 2016-09-28 DIAGNOSIS — R1013 Epigastric pain: Secondary | ICD-10-CM

## 2016-09-28 MED ORDER — IOPAMIDOL (ISOVUE-300) INJECTION 61%
100.0000 mL | Freq: Once | INTRAVENOUS | Status: AC | PRN
  Administered 2016-09-28: 100 mL via INTRAVENOUS

## 2016-10-11 DIAGNOSIS — R1013 Epigastric pain: Secondary | ICD-10-CM | POA: Diagnosis not present

## 2016-10-11 DIAGNOSIS — K439 Ventral hernia without obstruction or gangrene: Secondary | ICD-10-CM | POA: Diagnosis not present

## 2016-10-25 ENCOUNTER — Encounter: Payer: Self-pay | Admitting: *Deleted

## 2016-10-25 ENCOUNTER — Ambulatory Visit (INDEPENDENT_AMBULATORY_CARE_PROVIDER_SITE_OTHER): Payer: PPO | Admitting: Nurse Practitioner

## 2016-10-25 VITALS — BP 118/80 | Ht 65.5 in | Wt 196.0 lb

## 2016-10-25 DIAGNOSIS — R1013 Epigastric pain: Secondary | ICD-10-CM | POA: Diagnosis not present

## 2016-10-25 DIAGNOSIS — R112 Nausea with vomiting, unspecified: Secondary | ICD-10-CM | POA: Diagnosis not present
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
## 2016-10-25 NOTE — Patient Instructions (Signed)
You have been scheduled for an endoscopy. Please follow written instructions given to you at your visit today. If you use inhalers (even only as needed), please bring them with you on the day of your procedure. Your physician has requested that you go to www.startemmi.com and enter the access code given to you at your visit today. This web site gives a general overview about your procedure. However, you should still follow specific instructions given to you by our office regarding your preparation for the procedure.  If you are age 59 or older, your body mass index should be between 23-30. Your Body mass index is 32.12 kg/m. If this is out of the aforementioned range listed, please consider follow up with your Primary Care Provider.  If you are age 23 or younger, your body mass index should be between 19-25. Your Body mass index is 32.12 kg/m. If this is out of the aformentioned range listed, please consider follow up with your Primary Care Provider.

## 2016-10-26 ENCOUNTER — Encounter: Payer: Self-pay | Admitting: Nurse Practitioner

## 2016-10-26 ENCOUNTER — Ambulatory Visit (AMBULATORY_SURGERY_CENTER): Payer: PPO | Admitting: Gastroenterology

## 2016-10-26 ENCOUNTER — Encounter: Payer: Self-pay | Admitting: Gastroenterology

## 2016-10-26 VITALS — BP 142/86 | HR 79 | Temp 98.0°F | Resp 14 | Ht 65.5 in | Wt 196.0 lb

## 2016-10-26 DIAGNOSIS — D860 Sarcoidosis of lung: Secondary | ICD-10-CM | POA: Diagnosis not present

## 2016-10-26 DIAGNOSIS — K2970 Gastritis, unspecified, without bleeding: Secondary | ICD-10-CM

## 2016-10-26 DIAGNOSIS — K2990 Gastroduodenitis, unspecified, without bleeding: Secondary | ICD-10-CM

## 2016-10-26 DIAGNOSIS — R1010 Upper abdominal pain, unspecified: Secondary | ICD-10-CM | POA: Diagnosis not present

## 2016-10-26 DIAGNOSIS — K250 Acute gastric ulcer with hemorrhage: Secondary | ICD-10-CM

## 2016-10-26 DIAGNOSIS — R112 Nausea with vomiting, unspecified: Secondary | ICD-10-CM | POA: Diagnosis not present

## 2016-10-26 DIAGNOSIS — E669 Obesity, unspecified: Secondary | ICD-10-CM | POA: Diagnosis not present

## 2016-10-26 DIAGNOSIS — K2950 Unspecified chronic gastritis without bleeding: Secondary | ICD-10-CM | POA: Diagnosis not present

## 2016-10-26 DIAGNOSIS — R1013 Epigastric pain: Secondary | ICD-10-CM | POA: Diagnosis not present

## 2016-10-26 DIAGNOSIS — M329 Systemic lupus erythematosus, unspecified: Secondary | ICD-10-CM | POA: Diagnosis not present

## 2016-10-26 HISTORY — PX: ESOPHAGOGASTRODUODENOSCOPY: SHX1529

## 2016-10-26 MED ORDER — OMEPRAZOLE 40 MG PO CPDR: DELAYED_RELEASE_CAPSULE | ORAL | 11 refills | Status: DC

## 2016-10-26 MED ORDER — SODIUM CHLORIDE 0.9 % IV SOLN: 500.0000 mL | INTRAVENOUS | Status: DC

## 2016-10-26 NOTE — Op Note (Signed)
[facility] Patient Name: Stephanie Schmidt Procedure Date: 10/26/2016 3:45 PM MRN: [id number] Endoscopist: Mauri Pole , MD Age: 56 Referring MD:  Date of Birth: 12/01/1960 Gender: Female Account #: [id number] Procedure:                Upper GI endoscopy Indications:              Epigastric abdominal pain, Nausea with vomiting Medicines:                Monitored Anesthesia Care Procedure:                Pre-Anesthesia Assessment:                           - Prior to the procedure, a History and Physical                            was performed, and patient medications and                            allergies were reviewed. The patient's tolerance of                            previous anesthesia was also reviewed. The risks                            and benefits of the procedure and the sedation                            options and risks were discussed with the patient.                            All questions were answered, and informed consent                            was obtained. Prior Anticoagulants: The patient has                            taken no previous anticoagulant or antiplatelet                            agents. ASA Grade Assessment: II - A patient with                            mild systemic disease. After reviewing the risks                            and benefits, the patient was deemed in                            satisfactory condition to undergo the procedure.                           After obtaining informed consent, the endoscope was  passed under direct vision. Throughout the                            procedure, the patient's blood pressure, pulse, and                            oxygen saturations were monitored continuously. The                            Model GIF-HQ190 [id number]) scope was introduced                            through the mouth, and advanced to the second part   of duodenum. The upper GI endoscopy was                            accomplished without difficulty. The patient                            tolerated the procedure well. Scope In: Scope Out: Findings:                 LA Grade B (one or more mucosal breaks greater than                            5 mm, not extending between the tops of two mucosal                            folds) esophagitis with no bleeding was found 34 to                            35 cm from the incisors.                           Patchy severe inflammation characterized by                            congestion (edema), erosions, erythema, linear                            erosions and shallow ulcerations was found in the                            gastric antrum. Biopsies were taken with a cold                            forceps for histology from antrum and body of                            stomach.                           The examined duodenum was normal. Complications:            No immediate complications. Estimated Blood Loss:     Estimated  blood loss was minimal. Impression:               - LA Grade B esophagitis.                           - Gastritis. Biopsied.                           - Normal examined duodenum. Recommendation:           - Resume previous diet.                           - Continue present medications.                           - Await pathology results.                           - No ibuprofen, naproxen, or other non-steroidal                            anti-inflammatory drugs.                           - Use Prilosec (omeprazole) 40 mg PO daily, 30                            minutes before breakfast Mauri Pole, MD 10/26/2016 4:06:04 PM This report has been signed electronically.

## 2016-10-26 NOTE — Progress Notes (Signed)
Called to room to assist during endoscopic procedure.  Patient ID and intended procedure confirmed with present staff. Received instructions for my participation in the procedure from the performing physician.  

## 2016-10-26 NOTE — Patient Instructions (Signed)
  YOU HAD AN ENDOSCOPIC PROCEDURE TODAY AT [facility]:   Refer to the procedure report that was given to you for any specific questions about what was found during the examination.  If the procedure report does not answer your questions, please call your gastroenterologist to clarify.  If you requested that your care partner not be given the details of your procedure findings, then the procedure report has been included in a sealed envelope for you to review at your convenience later.  YOU SHOULD EXPECT: Some feelings of bloating in the abdomen. Passage of more gas than usual.  Walking can help get rid of the air that was put into your GI tract during the procedure and reduce the bloating. If you had a lower endoscopy (such as a colonoscopy or flexible sigmoidoscopy) you may notice spotting of blood in your stool or on the toilet paper. If you underwent a bowel prep for your procedure, you may not have a normal bowel movement for a few days.  Please Note:  You might notice some irritation and congestion in your nose or some drainage.  This is from the oxygen used during your procedure.  There is no need for concern and it should clear up in a day or so.  SYMPTOMS TO REPORT IMMEDIATELY:     Following upper endoscopy (EGD)  Vomiting of blood or coffee ground material  New chest pain or pain under the shoulder blades  Painful or persistently difficult swallowing  New shortness of breath  Fever of 100F or higher  Black, tarry-looking stools  For urgent or emergent issues, a gastroenterologist can be reached at any hour by calling [phone].   DIET:  We do recommend a small meal at first, but then you may proceed to your regular diet.  Drink plenty of fluids but you should avoid alcoholic beverages for 24 hours.  ACTIVITY:  You should plan to take it easy for the rest of today and you should NOT DRIVE or use heavy machinery until tomorrow (because of the sedation medicines  used during the test).    FOLLOW UP: Our staff will call the number listed on your records the next business day following your procedure to check on you and address any questions or concerns that you may have regarding the information given to you following your procedure. If we do not reach you, we will leave a message.  However, if you are feeling well and you are not experiencing any problems, there is no need to return our call.  We will assume that you have returned to your regular daily activities without incident.  If any biopsies were taken you will be contacted by phone or by letter within the next 1-3 weeks.  Please call us at [phone] if you have not heard about the biopsies in 3 weeks.    SIGNATURES/CONFIDENTIALITY: You and/or your care partner have signed paperwork which will be entered into your electronic medical record.  These signatures attest to the fact that that the information above on your After Visit Summary has been reviewed and is understood.  Full responsibility of the confidentiality of this discharge information lies with you and/or your care-partner.  NO IBUPROFEN,NAPROXEN, OR OTHER NON STEROIDAL ANTI INFLAMMATORY PRODUCTS   PICK UP PRILOSEC (OMEPRAZOLE ) AT [facility] .TAKE AS ORDERED TO [facility]AL [facility]LCER

## 2016-10-26 NOTE — Progress Notes (Signed)
To recovery, report to [place], RN, VSS 

## 2016-10-26 NOTE — Progress Notes (Signed)
Pt's states no medical or surgical changes since previsit or office visit. 

## 2016-10-29 ENCOUNTER — Telehealth: Payer: Self-pay

## 2016-10-29 NOTE — Telephone Encounter (Signed)
Called [phone] and it went to an answering service which required me to enter a mail box number to leave a message.  I tried to enter the telephone, which was incorrect.  Unable to leave a message. maw

## 2016-10-30 NOTE — Progress Notes (Signed)
Reviewed and agree with documentation and assessment and plan. K. Veena Kelven Flater , MD   

## 2016-11-05 ENCOUNTER — Encounter: Payer: Self-pay | Admitting: Gastroenterology

## 2018-04-15 ENCOUNTER — Other Ambulatory Visit: Payer: Self-pay | Admitting: Family Medicine

## 2018-04-15 DIAGNOSIS — Z1231 Encounter for screening mammogram for malignant neoplasm of breast: Secondary | ICD-10-CM

## 2018-04-21 ENCOUNTER — Ambulatory Visit
Admission: RE | Admit: 2018-04-21 | Discharge: 2018-04-21 | Disposition: A | Discharge status: Home / Self Care | Payer: PPO | Source: Ambulatory Visit | Attending: Family Medicine | Admitting: Family Medicine

## 2018-04-21 DIAGNOSIS — Z1231 Encounter for screening mammogram for malignant neoplasm of breast: Secondary | ICD-10-CM | POA: Diagnosis not present

## 2018-04-25 ENCOUNTER — Ambulatory Visit (INDEPENDENT_AMBULATORY_CARE_PROVIDER_SITE_OTHER): Payer: PPO | Admitting: Family Medicine

## 2018-04-25 ENCOUNTER — Other Ambulatory Visit (HOSPITAL_COMMUNITY)
Admission: RE | Admit: 2018-04-25 | Discharge: 2018-04-25 | Disposition: A | Discharge status: Home / Self Care | Payer: PPO | Source: Ambulatory Visit | Attending: Family Medicine | Admitting: Family Medicine

## 2018-04-25 ENCOUNTER — Encounter: Payer: Self-pay | Admitting: Family Medicine

## 2018-04-25 VITALS — BP 126/82 | HR 77 | Temp 98.4°F | Ht 66.0 in | Wt 189.4 lb

## 2018-04-25 DIAGNOSIS — R0602 Shortness of breath: Secondary | ICD-10-CM | POA: Diagnosis not present

## 2018-04-25 DIAGNOSIS — Z23 Encounter for immunization: Secondary | ICD-10-CM

## 2018-04-25 DIAGNOSIS — Z0000 Encounter for general adult medical examination without abnormal findings: Secondary | ICD-10-CM

## 2018-04-25 MED ORDER — CEPHALEXIN 500 MG PO CAPS: 500.0000 mg | ORAL_CAPSULE | Freq: Three times a day (TID) | ORAL | 0 refills | Status: DC

## 2018-04-25 NOTE — Addendum Note (Signed)
Addended by: Elie Confer on: 04/25/2018 01:00 PM   Modules accepted: Orders

## 2018-04-25 NOTE — Progress Notes (Signed)
Subjective:    Patient ID: Stephanie Schmidt, female    DOB: [birth year], 57 y.o.   MRN: [id number]  HPI Here for a well exam. She feels great in general but does mention a sore area on the right 3rd finger that started a week ago. She has stopped taking all NSAIDS and because of that all her GI symptoms have resolved. She not take use any type of acid blocker now.    Review of Systems  Constitutional: Negative.  Negative for activity change, appetite change, diaphoresis, fatigue, fever and unexpected weight change.  HENT: Negative.  Negative for congestion, ear pain, hearing loss, nosebleeds, sore throat, tinnitus, trouble swallowing and voice change.   Eyes: Negative.  Negative for photophobia, pain, discharge, redness and visual disturbance.  Respiratory: Negative.  Negative for apnea, cough, choking, chest tightness, shortness of breath, wheezing and stridor.   Cardiovascular: Negative.  Negative for chest pain, palpitations and leg swelling.  Gastrointestinal: Negative.  Negative for abdominal distention, abdominal pain, blood in stool, constipation, diarrhea, nausea, rectal pain and vomiting.  Genitourinary: Negative.  Negative for difficulty urinating, dysuria, enuresis, flank pain, frequency, hematuria, menstrual problem, urgency, vaginal bleeding, vaginal discharge and vaginal pain.  Musculoskeletal: Negative.  Negative for arthralgias, back pain, gait problem, joint swelling, myalgias, neck pain and neck stiffness.  Skin: Negative.  Negative for color change, pallor, rash and wound.  Neurological: Negative.  Negative for dizziness, tremors, seizures, syncope, speech difficulty, weakness, light-headedness, numbness and headaches.  Hematological: Negative for adenopathy. Does not bruise/bleed easily.  Psychiatric/Behavioral: Negative.  Negative for agitation, behavioral problems, confusion, dysphoric mood, hallucinations and sleep disturbance. The patient is not nervous/anxious.          Objective:   Physical Exam  Constitutional: She appears well-developed and well-nourished. No distress.  HENT:  Head: Normocephalic and atraumatic.  Right Ear: External ear normal.  Left Ear: External ear normal.  Nose: Nose normal.  Mouth/Throat: Oropharynx is clear and moist. No oropharyngeal exudate.  Eyes: Pupils are equal, round, and reactive to light. Conjunctivae and EOM are normal. Right eye exhibits no discharge. Left eye exhibits no discharge. No scleral icterus.  Neck: Normal range of motion. Neck supple. No JVD present. No thyromegaly present.  Cardiovascular: Normal rate, regular rhythm, normal heart sounds and intact distal pulses. Exam reveals no gallop and no friction rub.  No murmur heard. Pulmonary/Chest: Effort normal and breath sounds normal. No stridor. No respiratory distress. She has no wheezes. She has no rales. She exhibits no tenderness. No breast tenderness, discharge or bleeding.  Abdominal: Soft. Normal appearance and bowel sounds are normal. She exhibits no distension, no abdominal bruit, no ascites and no mass. There is no hepatosplenomegaly. There is no tenderness. There is no rigidity, no rebound and no guarding. No hernia.  Genitourinary: Rectum normal, vagina normal and uterus normal. No breast tenderness, discharge or bleeding. Cervix exhibits no motion tenderness, no discharge and no friability. Right adnexum displays no mass, no tenderness and no fullness. Left adnexum displays no mass, no tenderness and no fullness. No erythema, tenderness or bleeding in the vagina. No vaginal discharge found.  Musculoskeletal: Normal range of motion. She exhibits no edema or tenderness.  Lymphadenopathy:    She has no cervical adenopathy.  Neurological: She is alert. She has normal reflexes. No cranial nerve deficit. She exhibits normal muscle tone. Coordination normal.  Skin: Skin is warm and dry. No rash noted. She is not diaphoretic. No erythema. No pallor.  The skin  beside  the nail on the right 3rd finger is pink and puffy and tender   Psychiatric: She has a normal mood and affect. Her behavior is normal. Judgment and thought content normal.          Assessment & Plan:  Well exam. We discussed diet and exercise. Get fasting labs soon. Treat the paronychia with Keflex.  Alysia Penna, MD

## 2018-04-25 NOTE — Addendum Note (Signed)
Addended by: Elie Confer on: 04/25/2018 03:40 PM   Modules accepted: Orders

## 2018-04-28 ENCOUNTER — Ambulatory Visit (INDEPENDENT_AMBULATORY_CARE_PROVIDER_SITE_OTHER): Payer: PPO

## 2018-04-28 ENCOUNTER — Other Ambulatory Visit (INDEPENDENT_AMBULATORY_CARE_PROVIDER_SITE_OTHER): Payer: PPO

## 2018-04-28 DIAGNOSIS — R05 Cough: Secondary | ICD-10-CM | POA: Diagnosis not present

## 2018-04-28 DIAGNOSIS — R0602 Shortness of breath: Secondary | ICD-10-CM

## 2018-04-28 DIAGNOSIS — Z0000 Encounter for general adult medical examination without abnormal findings: Secondary | ICD-10-CM

## 2018-04-28 LAB — BASIC METABOLIC PANEL
BUN: 11 mg/dL (ref 6–23)
CALCIUM: 9.4 mg/dL (ref 8.4–10.5)
CO2: 26 mEq/L (ref 19–32)
Chloride: 104 mEq/L (ref 96–112)
Creatinine, Ser: 0.64 mg/dL (ref 0.40–1.20)
GFR: 122.55 mL/min (ref >60.00)
GLUCOSE: 87 mg/dL (ref 70–99)
POTASSIUM: 4.1 meq/L (ref 3.5–5.1)
Sodium: 138 mEq/L (ref 135–145)

## 2018-04-28 LAB — CBC WITH DIFFERENTIAL/PLATELET
BASOS PCT: 1.5 % (ref 0.0–3.0)
Basophils Absolute: 0.1 10*3/uL (ref 0.0–0.1)
EOS ABS: 0.2 10*3/uL (ref 0.0–0.7)
EOS PCT: 2.7 % (ref 0.0–5.0)
HCT: 41.9 % (ref 36.0–46.0)
Hemoglobin: 14.3 g/dL (ref 12.0–15.0)
LYMPHS ABS: 2.3 10*3/uL (ref 0.7–4.0)
Lymphocytes Relative: 34.2 % (ref 12.0–46.0)
MCHC: 34 g/dL (ref 30.0–36.0)
MCV: 96.5 fl (ref 78.0–100.0)
MONO ABS: 0.7 10*3/uL (ref 0.1–1.0)
Monocytes Relative: 11 % (ref 3.0–12.0)
NEUTROS PCT: 50.6 % (ref 43.0–77.0)
Neutro Abs: 3.4 10*3/uL (ref 1.4–7.7)
PLATELETS: 303 10*3/uL (ref 150.0–400.0)
RBC: 4.34 Mil/uL (ref 3.87–5.11)
RDW: 13.9 % (ref 11.5–15.5)
WBC: 6.7 10*3/uL (ref 4.0–10.5)

## 2018-04-28 LAB — HEPATIC FUNCTION PANEL
ALK PHOS: 94 U/L (ref 39–117)
ALT: 28 U/L (ref 0–35)
AST: 31 U/L (ref 0–37)
Albumin: 3.8 g/dL (ref 3.5–5.2)
BILIRUBIN DIRECT: 0.1 mg/dL (ref 0.0–0.3)
Total Bilirubin: 0.5 mg/dL (ref 0.2–1.2)
Total Protein: 7.6 g/dL (ref 6.0–8.3)

## 2018-04-28 LAB — LIPID PANEL
Cholesterol: 189 mg/dL (ref 0–200)
HDL: 51.3 mg/dL (ref >39.00)
LDL Cholesterol: 120 mg/dL — ABNORMAL HIGH (ref 0–99)
NONHDL: 137.42
Total CHOL/HDL Ratio: 4
Triglycerides: 85 mg/dL (ref 0.0–149.0)
VLDL: 17 mg/dL (ref 0.0–40.0)

## 2018-04-28 LAB — TSH: TSH: 2.05 u[IU]/mL (ref 0.35–4.50)

## 2018-04-28 LAB — POC URINALSYSI DIPSTICK (AUTOMATED)
Bilirubin, UA: NEGATIVE
Glucose, UA: NEGATIVE
Ketones, UA: NEGATIVE
Leukocytes, UA: NEGATIVE
Nitrite, UA: NEGATIVE
PH UA: 6 (ref 5.0–8.0)
PROTEIN UA: NEGATIVE
SPEC GRAV UA: 1.025 (ref 1.010–1.025)
Urobilinogen, UA: 0.2 E.U./dL

## 2018-04-28 NOTE — Addendum Note (Signed)
Addended by: Elie Confer on: 04/28/2018 10:42 AM   Modules accepted: Orders

## 2018-04-29 LAB — CYTOLOGY - PAP: DIAGNOSIS: NEGATIVE

## 2018-05-07 ENCOUNTER — Encounter: Payer: Self-pay | Admitting: Family Medicine

## 2018-05-07 ENCOUNTER — Ambulatory Visit (INDEPENDENT_AMBULATORY_CARE_PROVIDER_SITE_OTHER): Payer: PPO | Admitting: Family Medicine

## 2018-05-07 VITALS — BP 124/82 | HR 82 | Temp 98.2°F | Wt 189.4 lb

## 2018-05-07 DIAGNOSIS — M5432 Sciatica, left side: Secondary | ICD-10-CM

## 2018-05-07 MED ORDER — METHYLPREDNISOLONE 4 MG PO TBPK: ORAL_TABLET | ORAL | 0 refills | Status: DC

## 2018-05-07 NOTE — Progress Notes (Signed)
   Subjective:    Patient ID: Berneta Levins, female    DOB: [birth year], 57 y.o.   MRN: [id number]  HPI Here for 3 days of intermittent sharp pains in the left buttock and left thigh. No recent trauma. She got relief by taking a Electrical engineer. No numbness or weakness.    Review of Systems  Constitutional: Negative.   Respiratory: Negative.   Cardiovascular: Negative.   Musculoskeletal: Positive for back pain.       Objective:   Physical Exam  Constitutional: She appears well-developed and well-nourished.  Cardiovascular: Normal rate, regular rhythm, normal heart sounds and intact distal pulses.  Pulmonary/Chest: Effort normal and breath sounds normal.  Musculoskeletal:  She is tender in the left lower back and over the left sciatic notch. ROM is full. SLR are negative.           Assessment & Plan:  Sciatica. Use heat. Given a Medrol dose pack. Recheck prn.  Alysia Penna, MD

## 2018-05-20 ENCOUNTER — Ambulatory Visit: Payer: Self-pay

## 2019-03-05 ENCOUNTER — Other Ambulatory Visit: Payer: Self-pay

## 2019-03-05 ENCOUNTER — Telehealth (INDEPENDENT_AMBULATORY_CARE_PROVIDER_SITE_OTHER): Payer: Medicare Other | Admitting: Family Medicine

## 2019-03-05 ENCOUNTER — Encounter: Payer: Self-pay | Admitting: Family Medicine

## 2019-03-05 DIAGNOSIS — R300 Dysuria: Secondary | ICD-10-CM | POA: Diagnosis not present

## 2019-03-05 MED ORDER — NITROFURANTOIN MONOHYD MACRO 100 MG PO CAPS: 100.0000 mg | ORAL_CAPSULE | Freq: Two times a day (BID) | ORAL | 0 refills | Status: DC
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
## 2019-03-05 NOTE — Patient Instructions (Signed)
I sent the antibiotic (Macrobid) to the pharmacy.Please take according to instructions.  Can use Azo for symptoms per instructions as well - available over the counter.  I hope you are feeling better soon! Seek care promptly if your symptoms worsen, new concerns arise or you are not improving with treatment.

## 2019-04-10 ENCOUNTER — Other Ambulatory Visit: Payer: Self-pay | Admitting: Family Medicine

## 2019-04-10 DIAGNOSIS — Z1231 Encounter for screening mammogram for malignant neoplasm of breast: Secondary | ICD-10-CM

## 2019-04-23 ENCOUNTER — Ambulatory Visit: Payer: Medicare Other

## 2019-04-28 ENCOUNTER — Other Ambulatory Visit: Payer: Self-pay

## 2019-04-28 ENCOUNTER — Ambulatory Visit (INDEPENDENT_AMBULATORY_CARE_PROVIDER_SITE_OTHER): Payer: Medicare Other | Admitting: Family Medicine

## 2019-04-28 ENCOUNTER — Encounter: Payer: Self-pay | Admitting: Family Medicine

## 2019-04-28 VITALS — BP 110/78 | HR 75 | Temp 97.2°F | Ht 66.0 in | Wt 178.6 lb

## 2019-04-28 DIAGNOSIS — R590 Localized enlarged lymph nodes: Secondary | ICD-10-CM

## 2019-04-28 DIAGNOSIS — Z23 Encounter for immunization: Secondary | ICD-10-CM

## 2019-04-28 DIAGNOSIS — Z0000 Encounter for general adult medical examination without abnormal findings: Secondary | ICD-10-CM

## 2019-04-28 LAB — LIPID PANEL
Cholesterol: 213 mg/dL — ABNORMAL HIGH (ref 0–200)
HDL: 57.7 mg/dL (ref >39.00)
LDL Cholesterol: 119 mg/dL — ABNORMAL HIGH (ref 0–99)
NonHDL: 155.04
Total CHOL/HDL Ratio: 4
Triglycerides: 181 mg/dL — ABNORMAL HIGH (ref 0.0–149.0)
VLDL: 36.2 mg/dL (ref 0.0–40.0)

## 2019-04-28 LAB — BASIC METABOLIC PANEL
BUN: 7 mg/dL (ref 6–23)
CO2: 26 mEq/L (ref 19–32)
Calcium: 9.9 mg/dL (ref 8.4–10.5)
Chloride: 102 mEq/L (ref 96–112)
Creatinine, Ser: 0.66 mg/dL (ref 0.40–1.20)
GFR: 110.9 mL/min (ref >60.00)
Glucose, Bld: 81 mg/dL (ref 70–99)
Potassium: 3.7 mEq/L (ref 3.5–5.1)
Sodium: 139 mEq/L (ref 135–145)

## 2019-04-28 LAB — CBC WITH DIFFERENTIAL/PLATELET
Basophils Absolute: 0.1 10*3/uL (ref 0.0–0.1)
Basophils Relative: 1.3 % (ref 0.0–3.0)
Eosinophils Absolute: 0.2 10*3/uL (ref 0.0–0.7)
Eosinophils Relative: 2 % (ref 0.0–5.0)
HCT: 43.2 % (ref 36.0–46.0)
Hemoglobin: 14.9 g/dL (ref 12.0–15.0)
Lymphocytes Relative: 27.7 % (ref 12.0–46.0)
Lymphs Abs: 2.2 10*3/uL (ref 0.7–4.0)
MCHC: 34.5 g/dL (ref 30.0–36.0)
MCV: 102 fl — ABNORMAL HIGH (ref 78.0–100.0)
Monocytes Absolute: 0.8 10*3/uL (ref 0.1–1.0)
Monocytes Relative: 10.1 % (ref 3.0–12.0)
Neutro Abs: 4.6 10*3/uL (ref 1.4–7.7)
Neutrophils Relative %: 58.9 % (ref 43.0–77.0)
Platelets: 230 10*3/uL (ref 150.0–400.0)
RBC: 4.24 Mil/uL (ref 3.87–5.11)
RDW: 13.9 % (ref 11.5–15.5)
WBC: 7.8 10*3/uL (ref 4.0–10.5)

## 2019-04-28 LAB — HEPATIC FUNCTION PANEL
ALT: 19 U/L (ref 0–35)
AST: 28 U/L (ref 0–37)
Albumin: 4 g/dL (ref 3.5–5.2)
Alkaline Phosphatase: 107 U/L (ref 39–117)
Bilirubin, Direct: 0.2 mg/dL (ref 0.0–0.3)
Total Bilirubin: 1.1 mg/dL (ref 0.2–1.2)
Total Protein: 7.7 g/dL (ref 6.0–8.3)

## 2019-04-28 LAB — TSH: TSH: 4.23 u[IU]/mL (ref 0.35–4.50)

## 2019-04-28 NOTE — Patient Instructions (Signed)
Health Maintenance Due  Topic Date Due  . Hepatitis C Screening  05-17-1961  . HIV Screening  04/12/1975  . INFLUENZA VACCINE  03/07/2019    Depression screen PHQ 2/9 04/25/2018  Decreased Interest 0  Down, Depressed, Hopeless 0  PHQ - 2 Score 0

## 2019-04-28 NOTE — Progress Notes (Signed)
Subjective:    Patient ID: Stephanie Schmidt, female    DOB: [birth year], 59 y.o.   MRN: [id number]  HPI Here for a well exam. She has 2 concerns. First about a year ago a lump came up in the right neck which has persisted since then. It is not painful. No head or throat complaints, no recent dental issues. Also she has left knee pain when walking up or down steps. Using Diclofenac gel which helps. She cannot take NSAIDs due to GI problems.    Review of Systems  Constitutional: Negative.   HENT: Negative.   Eyes: Negative.   Respiratory: Negative.   Cardiovascular: Negative.   Gastrointestinal: Negative.   Genitourinary: Negative for decreased urine volume, difficulty urinating, dyspareunia, dysuria, enuresis, flank pain, frequency, hematuria, pelvic pain and urgency.  Musculoskeletal: Positive for arthralgias.  Skin: Negative.   Neurological: Negative.   Hematological: Positive for adenopathy.  Psychiatric/Behavioral: Negative.        Objective:   Physical Exam Constitutional:      General: She is not in acute distress.    Appearance: She is well-developed.  HENT:     Head: Normocephalic and atraumatic.     Right Ear: External ear normal.     Left Ear: External ear normal.     Nose: Nose normal.     Mouth/Throat:     Pharynx: No oropharyngeal exudate.  Eyes:     General: No scleral icterus.    Conjunctiva/sclera: Conjunctivae normal.     Pupils: Pupils are equal, round, and reactive to light.  Neck:     Musculoskeletal: Normal range of motion and neck supple.     Thyroid: No thyromegaly.     Vascular: No JVD.     Comments: Single enlarged non-tender right cervical node  Cardiovascular:     Rate and Rhythm: Normal rate and regular rhythm.     Heart sounds: Normal heart sounds. No murmur. No friction rub. No gallop.   Pulmonary:     Effort: Pulmonary effort is normal. No respiratory distress.     Breath sounds: Normal breath sounds. No wheezing or rales.  Chest:   Chest wall: No tenderness.  Abdominal:     General: Bowel sounds are normal. There is no distension.     Palpations: Abdomen is soft. There is no mass.     Tenderness: There is no abdominal tenderness. There is no guarding or rebound.  Musculoskeletal: Normal range of motion.        General: No tenderness.  Skin:    General: Skin is warm and dry.     Findings: No erythema or rash.  Neurological:     Mental Status: She is alert and oriented to person, place, and time.     Cranial Nerves: No cranial nerve deficit.     Motor: No abnormal muscle tone.     Coordination: Coordination normal.     Deep Tendon Reflexes: Reflexes are normal and symmetric. Reflexes normal.  Psychiatric:        Behavior: Behavior normal.        Thought Content: Thought content normal.        Judgment: Judgment normal.           Assessment & Plan:  Well exam. We discussed diet and exercise. Get fasting labs. For the cervical node, we will set up a contrasted neck CT scan. She has some osteoarthritis in the left knee. In addition to Diclofenac gel she can try Tylenol as needed.  Alysia Penna, MD

## 2019-05-01 ENCOUNTER — Encounter: Payer: Medicare Other | Admitting: Family Medicine

## 2019-05-13 ENCOUNTER — Other Ambulatory Visit: Payer: Self-pay

## 2019-05-13 ENCOUNTER — Ambulatory Visit
Admission: RE | Admit: 2019-05-13 | Discharge: 2019-05-13 | Disposition: A | Discharge status: Home / Self Care | Payer: Medicare Other | Source: Ambulatory Visit | Attending: Family Medicine | Admitting: Family Medicine

## 2019-05-13 DIAGNOSIS — Z1231 Encounter for screening mammogram for malignant neoplasm of breast: Secondary | ICD-10-CM | POA: Diagnosis not present

## 2019-05-14 ENCOUNTER — Ambulatory Visit
Admission: RE | Admit: 2019-05-14 | Discharge: 2019-05-14 | Disposition: A | Discharge status: Home / Self Care | Payer: Medicare Other | Source: Ambulatory Visit | Attending: Family Medicine | Admitting: Family Medicine

## 2019-05-14 DIAGNOSIS — R590 Localized enlarged lymph nodes: Secondary | ICD-10-CM

## 2019-05-14 MED ORDER — IOHEXOL 300 MG/ML  SOLN: 75.0000 mL | Freq: Once | INTRAMUSCULAR | Status: DC | PRN

## 2019-05-15 NOTE — Addendum Note (Signed)
Addended by: Alysia Penna A on: 05/15/2019 08:20 AM   Modules accepted: Orders

## 2019-05-19 ENCOUNTER — Ambulatory Visit (HOSPITAL_COMMUNITY)
Admission: RE | Admit: 2019-05-19 | Discharge: 2019-05-19 | Disposition: A | Discharge status: Home / Self Care | Payer: Medicare Other | Source: Ambulatory Visit | Attending: Family Medicine | Admitting: Family Medicine

## 2019-05-19 ENCOUNTER — Encounter (HOSPITAL_COMMUNITY): Payer: Self-pay

## 2019-05-19 ENCOUNTER — Other Ambulatory Visit: Payer: Self-pay

## 2019-05-19 DIAGNOSIS — R221 Localized swelling, mass and lump, neck: Secondary | ICD-10-CM | POA: Diagnosis not present

## 2019-05-19 DIAGNOSIS — R590 Localized enlarged lymph nodes: Secondary | ICD-10-CM | POA: Diagnosis not present

## 2019-05-19 MED ORDER — IOHEXOL 300 MG/ML  SOLN
75.0000 mL | Freq: Once | INTRAMUSCULAR | Status: AC | PRN
  Administered 2019-05-19: 75 mL via INTRAVENOUS

## 2019-05-21 ENCOUNTER — Telehealth: Payer: Self-pay | Admitting: Family Medicine

## 2019-05-21 NOTE — Telephone Encounter (Signed)
Copied from [place] [id number]. Topic: General - Other >> May 21, 2019  8:46 AM Keene Breath wrote: Reason for CRM: Patient stated she is calling the nurse back regarding her CT test results.  Please call patient back to discuss.  CB# [phone]

## 2019-09-28 ENCOUNTER — Telehealth: Payer: Self-pay | Admitting: Family Medicine
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
## 2020-04-21 ENCOUNTER — Other Ambulatory Visit: Payer: Self-pay | Admitting: Family Medicine

## 2020-04-21 DIAGNOSIS — Z1231 Encounter for screening mammogram for malignant neoplasm of breast: Secondary | ICD-10-CM

## 2020-05-13 ENCOUNTER — Ambulatory Visit: Payer: Medicare Other

## 2020-05-17 ENCOUNTER — Other Ambulatory Visit: Payer: Self-pay

## 2020-05-17 ENCOUNTER — Ambulatory Visit
Admission: RE | Admit: 2020-05-17 | Discharge: 2020-05-17 | Disposition: A | Discharge status: Home / Self Care | Payer: Medicare Other | Source: Ambulatory Visit | Attending: Family Medicine | Admitting: Family Medicine

## 2020-05-17 DIAGNOSIS — Z1231 Encounter for screening mammogram for malignant neoplasm of breast: Secondary | ICD-10-CM | POA: Diagnosis not present

## 2020-05-20 ENCOUNTER — Ambulatory Visit (INDEPENDENT_AMBULATORY_CARE_PROVIDER_SITE_OTHER): Payer: Medicare Other

## 2020-05-20 ENCOUNTER — Encounter: Payer: Self-pay | Admitting: Family Medicine

## 2020-05-20 ENCOUNTER — Other Ambulatory Visit: Payer: Self-pay

## 2020-05-20 ENCOUNTER — Ambulatory Visit (INDEPENDENT_AMBULATORY_CARE_PROVIDER_SITE_OTHER): Payer: Medicare Other | Admitting: Family Medicine

## 2020-05-20 VITALS — BP 122/78 | HR 88 | Temp 99.4°F | Ht 66.0 in | Wt 156.2 lb

## 2020-05-20 DIAGNOSIS — Z23 Encounter for immunization: Secondary | ICD-10-CM | POA: Diagnosis not present

## 2020-05-20 DIAGNOSIS — E559 Vitamin D deficiency, unspecified: Secondary | ICD-10-CM

## 2020-05-20 DIAGNOSIS — M329 Systemic lupus erythematosus, unspecified: Secondary | ICD-10-CM

## 2020-05-20 DIAGNOSIS — D869 Sarcoidosis, unspecified: Secondary | ICD-10-CM

## 2020-05-20 DIAGNOSIS — Z209 Contact with and (suspected) exposure to unspecified communicable disease: Secondary | ICD-10-CM

## 2020-05-20 DIAGNOSIS — B351 Tinea unguium: Secondary | ICD-10-CM

## 2020-05-20 DIAGNOSIS — E785 Hyperlipidemia, unspecified: Secondary | ICD-10-CM | POA: Diagnosis not present

## 2020-05-20 MED ORDER — TERBINAFINE HCL 250 MG PO TABS: 250.0000 mg | ORAL_TABLET | Freq: Every day | ORAL | 1 refills | Status: DC

## 2020-05-20 NOTE — Addendum Note (Signed)
Addended by: Marrion Coy on: 05/20/2020 10:57 AM   Modules accepted: Orders

## 2020-05-20 NOTE — Addendum Note (Signed)
Addended by: Matilde Sprang on: 05/20/2020 04:05 PM   Modules accepted: Orders

## 2020-05-20 NOTE — Progress Notes (Signed)
   Subjective:    Patient ID: Stephanie Schmidt, female    DOB: Dec 14, 1960, 59 y.o.   MRN: [id number]  HPI Here to follow up on issues. She feels good and has no concerns. She has lost 28 lbs in the past year and she has more energy. No chest pain or cough or SOB. She has had 2 Moderna Covid vaccines.    Review of Systems  Constitutional: Negative.   HENT: Negative.   Eyes: Negative.   Respiratory: Negative.   Cardiovascular: Negative.   Gastrointestinal: Negative.   Genitourinary: Negative for decreased urine volume, difficulty urinating, dyspareunia, dysuria, enuresis, flank pain, frequency, hematuria, pelvic pain and urgency.  Musculoskeletal: Negative.   Skin: Negative.   Neurological: Negative.   Psychiatric/Behavioral: Negative.        Objective:   Physical Exam Constitutional:      General: She is not in acute distress.    Appearance: She is well-developed.  HENT:     Head: Normocephalic and atraumatic.     Right Ear: External ear normal.     Left Ear: External ear normal.     Nose: Nose normal.     Mouth/Throat:     Pharynx: No oropharyngeal exudate.  Eyes:     General: No scleral icterus.    Conjunctiva/sclera: Conjunctivae normal.     Pupils: Pupils are equal, round, and reactive to light.  Neck:     Thyroid: No thyromegaly.     Vascular: No JVD.  Cardiovascular:     Rate and Rhythm: Normal rate and regular rhythm.     Heart sounds: Normal heart sounds. No murmur heard.  No friction rub. No gallop.   Pulmonary:     Effort: Pulmonary effort is normal. No respiratory distress.     Breath sounds: Normal breath sounds. No wheezing or rales.  Chest:     Chest wall: No tenderness.  Abdominal:     General: Bowel sounds are normal. There is no distension.     Palpations: Abdomen is soft. There is no mass.     Tenderness: There is no abdominal tenderness. There is no guarding or rebound.  Musculoskeletal:        General: No tenderness. Normal range of motion.      Cervical back: Normal range of motion and neck supple.  Lymphadenopathy:     Cervical: No cervical adenopathy.  Skin:    General: Skin is warm and dry.     Findings: No erythema or rash.     Comments: She has thickening and discoloration of multiple toenails  Neurological:     Mental Status: She is alert and oriented to person, place, and time.     Cranial Nerves: No cranial nerve deficit.     Motor: No abnormal muscle tone.     Coordination: Coordination normal.     Deep Tendon Reflexes: Reflexes are normal and symmetric. Reflexes normal.  Psychiatric:        Behavior: Behavior normal.        Thought Content: Thought content normal.        Judgment: Judgment normal.           Assessment & Plan:  She is doing well and the sarcoidosis and lupus appear to be well controlled. We will get a CXR today . Get fasting labs. Given a flu shot. Treat the onchycomycosis with Terbinafine.  Alysia Penna, MD

## 2020-05-23 LAB — HEPATITIS C ANTIBODY
Hepatitis C Ab: NONREACTIVE
SIGNAL TO CUT-OFF: 0.05 (ref <1.00)

## 2020-05-23 LAB — LIPID PANEL
Cholesterol: 173 mg/dL (ref <200)
HDL: 63 mg/dL (ref >=50)
LDL Cholesterol (Calc): 85 mg/dL (calc)
Non-HDL Cholesterol (Calc): 110 mg/dL (calc) (ref <130)
Total CHOL/HDL Ratio: 2.7 (calc) (ref <5.0)
Triglycerides: 151 mg/dL — ABNORMAL HIGH (ref <150)

## 2020-05-23 LAB — HEPATIC FUNCTION PANEL
AG Ratio: 0.9 (calc) — ABNORMAL LOW (ref 1.0–2.5)
ALT: 30 U/L — ABNORMAL HIGH (ref 6–29)
AST: 52 U/L — ABNORMAL HIGH (ref 10–35)
Albumin: 3.7 g/dL (ref 3.6–5.1)
Alkaline phosphatase (APISO): 107 U/L (ref 37–153)
Bilirubin, Direct: 0.2 mg/dL (ref 0.0–0.2)
Globulin: 4.2 g/dL (calc) — ABNORMAL HIGH (ref 1.9–3.7)
Indirect Bilirubin: 0.8 mg/dL (calc) (ref 0.2–1.2)
Total Bilirubin: 1 mg/dL (ref 0.2–1.2)
Total Protein: 7.9 g/dL (ref 6.1–8.1)

## 2020-05-23 LAB — CBC WITH DIFFERENTIAL/PLATELET
Absolute Monocytes: 535 cells/uL (ref 200–950)
Basophils Absolute: 90 cells/uL (ref 0–200)
Basophils Relative: 1.7 %
Eosinophils Absolute: 143 cells/uL (ref 15–500)
Eosinophils Relative: 2.7 %
HCT: 45.7 % — ABNORMAL HIGH (ref 35.0–45.0)
Hemoglobin: 16.1 g/dL — ABNORMAL HIGH (ref 11.7–15.5)
Lymphs Abs: 2088 cells/uL (ref 850–3900)
MCH: 35.5 pg — ABNORMAL HIGH (ref 27.0–33.0)
MCHC: 35.2 g/dL (ref 32.0–36.0)
MCV: 100.7 fL — ABNORMAL HIGH (ref 80.0–100.0)
MPV: 11.2 fL (ref 7.5–12.5)
Monocytes Relative: 10.1 %
Neutro Abs: 2443 cells/uL (ref 1500–7800)
Neutrophils Relative %: 46.1 %
Platelets: 168 10*3/uL (ref 140–400)
RBC: 4.54 10*6/uL (ref 3.80–5.10)
RDW: 12.8 % (ref 11.0–15.0)
Total Lymphocyte: 39.4 %
WBC: 5.3 10*3/uL (ref 3.8–10.8)

## 2020-05-23 LAB — BASIC METABOLIC PANEL
BUN/Creatinine Ratio: 6 (calc) (ref 6–22)
BUN: 4 mg/dL — ABNORMAL LOW (ref 7–25)
CO2: 26 mmol/L (ref 20–32)
Calcium: 9.6 mg/dL (ref 8.6–10.4)
Chloride: 102 mmol/L (ref 98–110)
Creat: 0.62 mg/dL (ref 0.50–0.99)
Glucose, Bld: 94 mg/dL (ref 65–99)
Potassium: 3.8 mmol/L (ref 3.5–5.3)
Sodium: 140 mmol/L (ref 135–146)

## 2020-05-23 LAB — VITAMIN D 25 HYDROXY (VIT D DEFICIENCY, FRACTURES): Vit D, 25-Hydroxy: 11 ng/mL — ABNORMAL LOW (ref 30–100)

## 2020-05-23 LAB — TSH: TSH: 2.37 mIU/L (ref 0.40–4.50)

## 2020-07-18 IMAGING — MG MM DIGITAL SCREENING BILAT W/ TOMO W/ CAD
8 series · 8 of 24 positions shown · non-contrast
Comparison: Previous exam(s).

CLINICAL DATA: Screening.

EXAM:
DIGITAL SCREENING BILATERAL MAMMOGRAM WITH TOMO AND CAD

[R CC synth-2D]
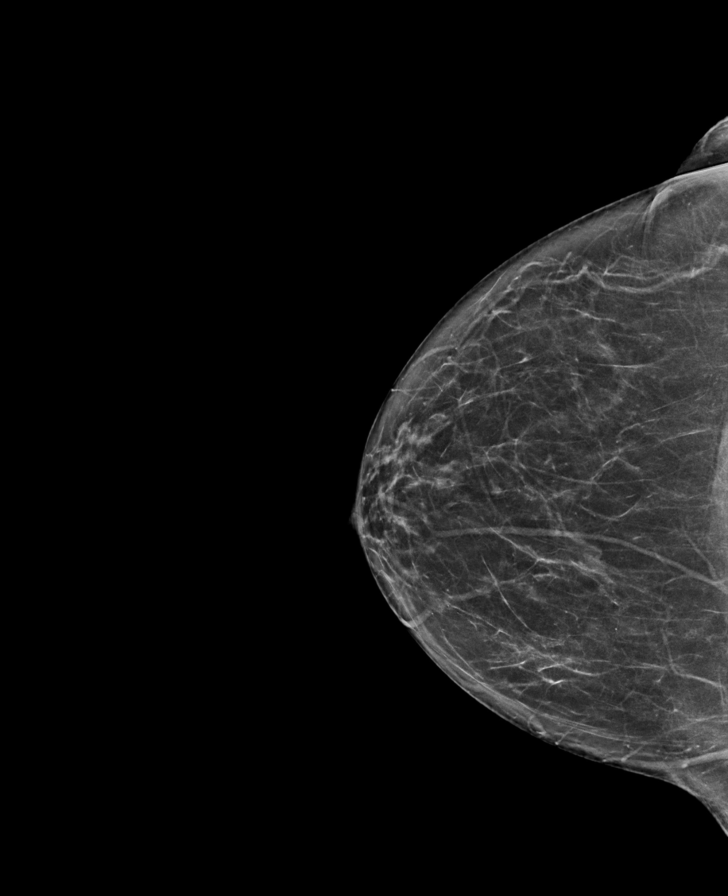

[R MLO synth-2D]
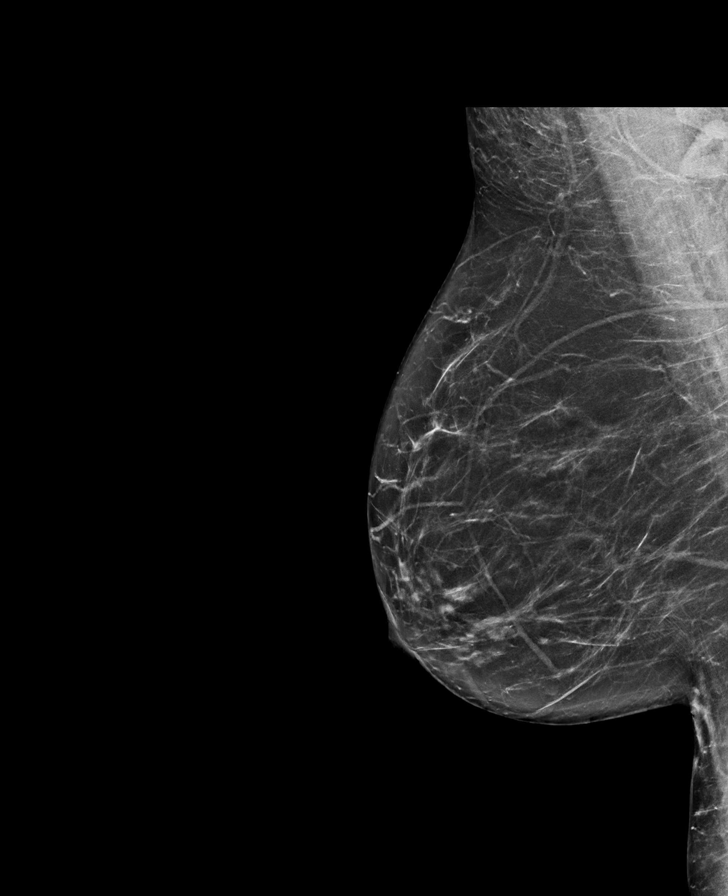

[L MLO synth-2D]
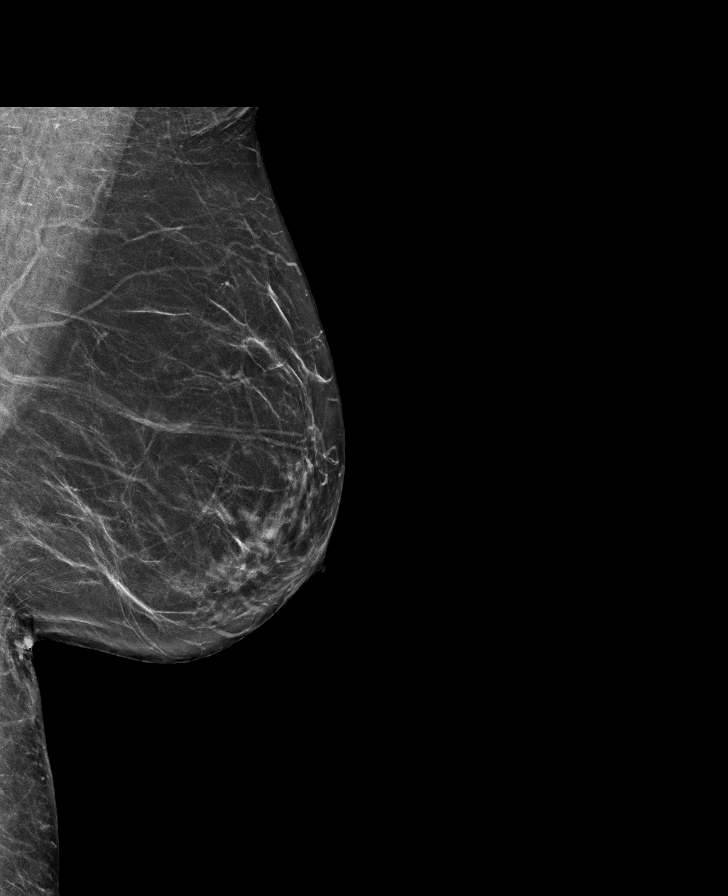

[L CC synth-2D]
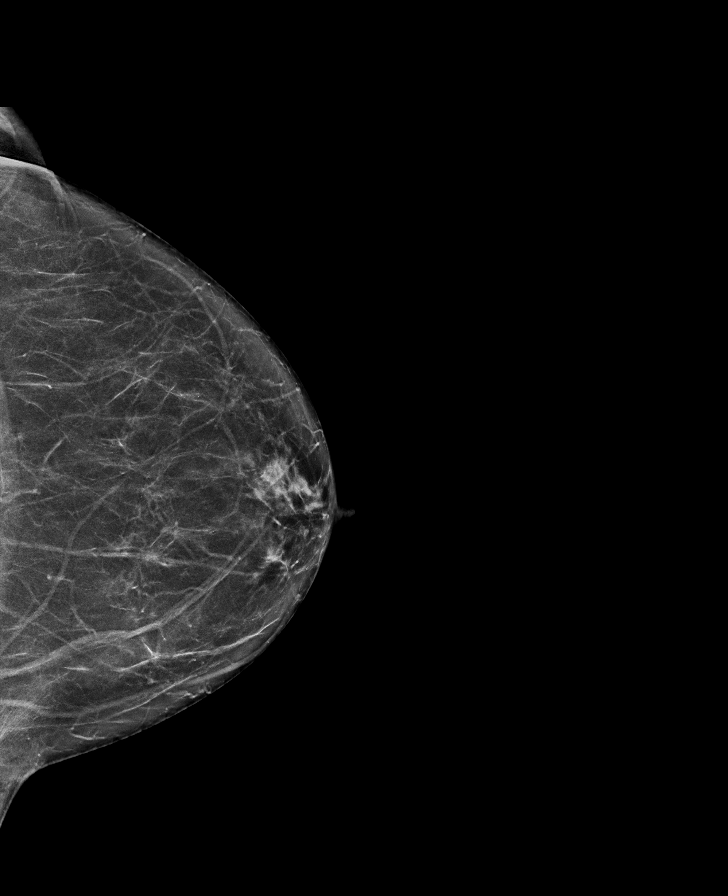

[L CC tomo · tomo slice 33/64.0]
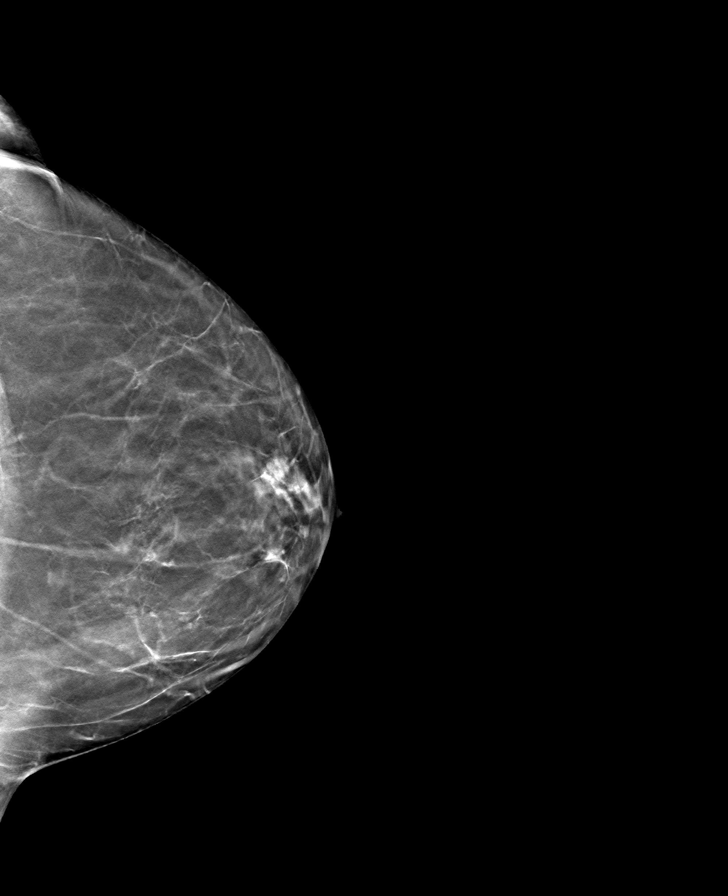

[R MLO tomo · tomo slice 35/70.0]
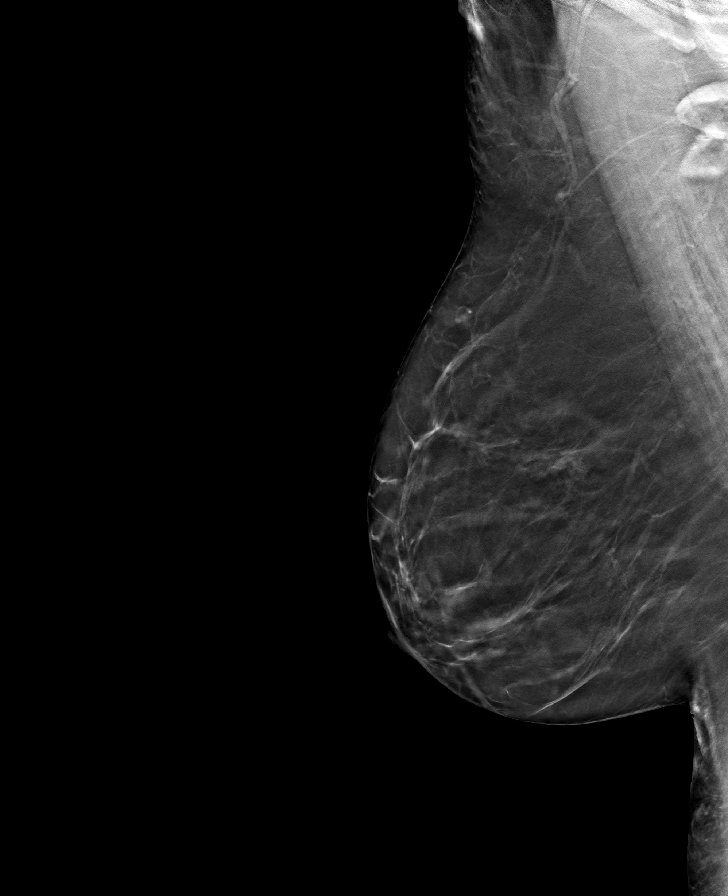

[R CC tomo · tomo slice 33/65.0]
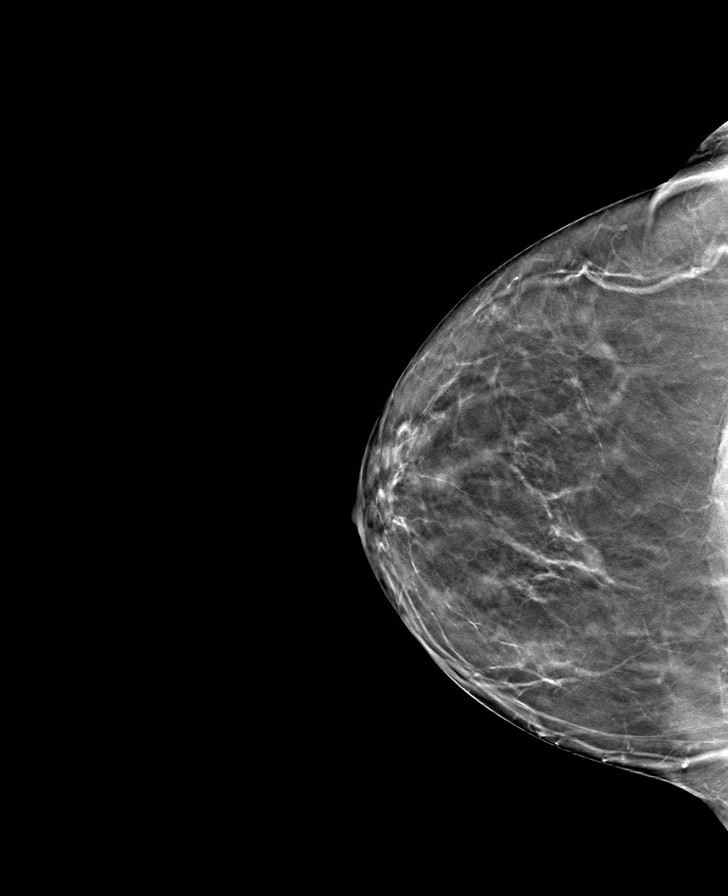

[L MLO tomo · tomo slice 33/66.0]
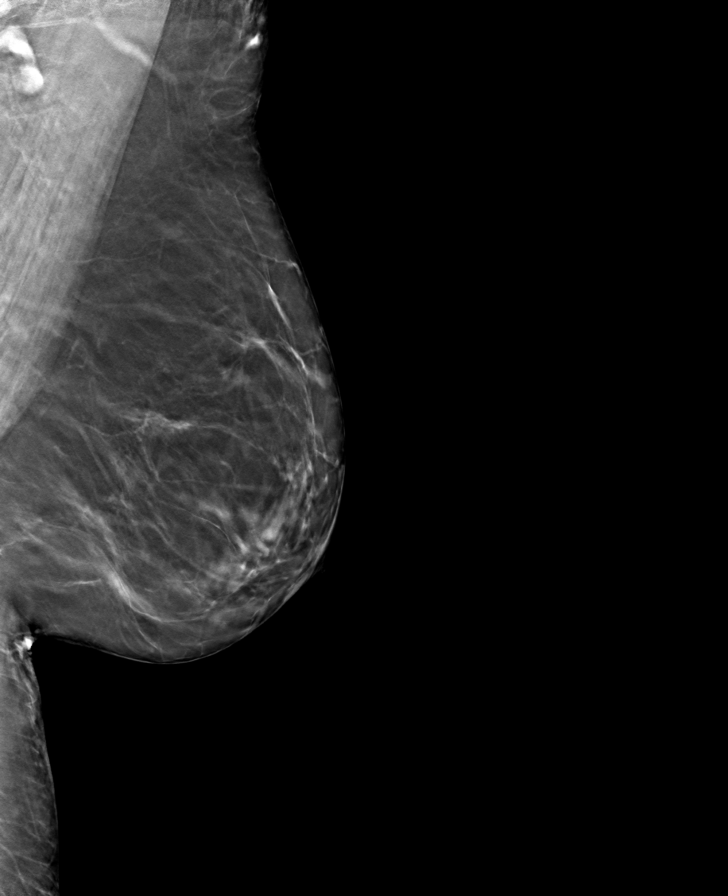

[8 of 24 positions shown; findings below may reference images not displayed]

ACR Breast Density Category b: There are scattered areas of
fibroglandular density.
FINDINGS: There are no findings suspicious for malignancy. Images were
processed with CAD.
IMPRESSION: No mammographic evidence of malignancy. A result letter of this
screening mammogram will be mailed directly to the patient.

RECOMMENDATION:
Screening mammogram in one year. (Code:CN-U-775)

BI-RADS CATEGORY  1: Negative.
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
## 2020-07-28 ENCOUNTER — Ambulatory Visit: Payer: Medicare Other

## 2020-10-14 ENCOUNTER — Other Ambulatory Visit: Payer: Self-pay

## 2020-10-14 ENCOUNTER — Telehealth: Payer: Self-pay | Admitting: Family Medicine

## 2020-10-14 ENCOUNTER — Ambulatory Visit (INDEPENDENT_AMBULATORY_CARE_PROVIDER_SITE_OTHER): Payer: Medicare Other

## 2020-10-14 VITALS — BP 120/84 | HR 72 | Temp 98.1°F | Ht 66.0 in | Wt 156.4 lb

## 2020-10-14 DIAGNOSIS — Z0000 Encounter for general adult medical examination without abnormal findings: Secondary | ICD-10-CM | POA: Diagnosis not present
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
## 2020-10-14 NOTE — Telephone Encounter (Signed)
Form has been placed on Dr Sarajane Jews folder for signature, pt will be notified when form is complete

## 2020-10-14 NOTE — Patient Instructions (Signed)
Stephanie Schmidt , Thank you for taking time to come for your Medicare Wellness Visit. I appreciate your ongoing commitment to your health goals. Please review the following plan we discussed and let me know if I can assist you in the future.   Screening recommendations/referrals: Colonoscopy: Up to date, next due 03/29/2021 Mammogram: Up to date, next due 05/17/2021 Bone Density: Not due until age 61 Recommended yearly ophthalmology/optometry visit for glaucoma screening and checkup Recommended yearly dental visit for hygiene and checkup  Vaccinations: Influenza vaccine: Up to date, next due fall 2022  Pneumococcal vaccine: Completed series  Tdap vaccine: Up to date, next due 04/25/2028 Shingles vaccine: Completed series     Advanced directives: Information given on Advanced Directives please review and complete   Conditions/risks identified: None   Next appointment: 10/16/2021 @ 10:30 am with [place] 65 Years and Older, Female Preventive care refers to lifestyle choices and visits with your health care provider that can promote health and wellness. What does preventive care include?  A yearly physical exam. This is also called an annual well check.  Dental exams once or twice a year.  Routine eye exams. Ask your health care provider how often you should have your eyes checked.  Personal lifestyle choices, including:  Daily care of your teeth and gums.  Regular physical activity.  Eating a healthy diet.  Avoiding tobacco and drug use.  Limiting alcohol use.  Practicing safe sex.  Taking low-dose aspirin every day.  Taking vitamin and mineral supplements as recommended by your health care provider. What happens during an annual well check? The services and screenings done by your health care provider during your annual well check will depend on your age, overall health, lifestyle risk factors, and family history of disease. Counseling   Your health care provider may ask you questions about your:  Alcohol use.  Tobacco use.  Drug use.  Emotional well-being.  Home and relationship well-being.  Sexual activity.  Eating habits.  History of falls.  Memory and ability to understand (cognition).  Work and work Statistician.  Reproductive health. Screening  You may have the following tests or measurements:  Height, weight, and BMI.  Blood pressure.  Lipid and cholesterol levels. These may be checked every 5 years, or more frequently if you are over 16 years old.  Skin check.  Lung cancer screening. You may have this screening every year starting at age 27 if you have a 30-pack-year history of smoking and currently smoke or have quit within the past 15 years.  Fecal occult blood test (FOBT) of the stool. You may have this test every year starting at age 74.  Flexible sigmoidoscopy or colonoscopy. You may have a sigmoidoscopy every 5 years or a colonoscopy every 10 years starting at age 68.  Hepatitis C blood test.  Hepatitis B blood test.  Sexually transmitted disease (STD) testing.  Diabetes screening. This is done by checking your blood sugar (glucose) after you have not eaten for a while (fasting). You may have this done every 1-3 years.  Bone density scan. This is done to screen for osteoporosis. You may have this done starting at age 79.  Mammogram. This may be done every 1-2 years. Talk to your health care provider about how often you should have regular mammograms. Talk with your health care provider about your test results, treatment options, and if necessary, the need for more tests. Vaccines  Your health care provider may  recommend certain vaccines, such as:  Influenza vaccine. This is recommended every year.  Tetanus, diphtheria, and acellular pertussis (Tdap, Td) vaccine. You may need a Td booster every 10 years.  Zoster vaccine. You may need this after age 13.  Pneumococcal 13-valent  conjugate (PCV13) vaccine. One dose is recommended after age 47.  Pneumococcal polysaccharide (PPSV23) vaccine. One dose is recommended after age 22. Talk to your health care provider about which screenings and vaccines you need and how often you need them. This information is not intended to replace advice given to you by your health care provider. Make sure you discuss any questions you have with your health care provider. Document Released: 08/19/2015 Document Revised: 04/11/2016 Document Reviewed: 05/24/2015 Elsevier Interactive Patient Education  2017 [place] Prevention in the Home Falls can cause injuries. They can happen to people of all ages. There are many things you can do to make your home safe and to help prevent falls. What can I do on the outside of my home?  Regularly fix the edges of walkways and driveways and fix any cracks.  Remove anything that might make you trip as you walk through a door, such as a raised step or threshold.  Trim any bushes or trees on the path to your home.  Use bright outdoor lighting.  Clear any walking paths of anything that might make someone trip, such as rocks or tools.  Regularly check to see if handrails are loose or broken. Make sure that both sides of any steps have handrails.  Any raised decks and porches should have guardrails on the edges.  Have any leaves, snow, or ice cleared regularly.  Use sand or salt on walking paths during winter.  Clean up any spills in your garage right away. This includes oil or grease spills. What can I do in the bathroom?  Use night lights.  Install grab bars by the toilet and in the tub and shower. Do not use towel bars as grab bars.  Use non-skid mats or decals in the tub or shower.  If you need to sit down in the shower, use a plastic, non-slip stool.  Keep the floor dry. Clean up any water that spills on the floor as soon as it happens.  Remove soap buildup in the tub or  shower regularly.  Attach bath mats securely with double-sided non-slip rug tape.  Do not have throw rugs and other things on the floor that can make you trip. What can I do in the bedroom?  Use night lights.  Make sure that you have a light by your bed that is easy to reach.  Do not use any sheets or blankets that are too big for your bed. They should not hang down onto the floor.  Have a firm chair that has side arms. You can use this for support while you get dressed.  Do not have throw rugs and other things on the floor that can make you trip. What can I do in the kitchen?  Clean up any spills right away.  Avoid walking on wet floors.  Keep items that you use a lot in easy-to-reach places.  If you need to reach something above you, use a strong step stool that has a grab bar.  Keep electrical cords out of the way.  Do not use floor polish or wax that makes floors slippery. If you must use wax, use non-skid floor wax.  Do not have throw rugs and  other things on the floor that can make you trip. What can I do with my stairs?  Do not leave any items on the stairs.  Make sure that there are handrails on both sides of the stairs and use them. Fix handrails that are broken or loose. Make sure that handrails are as long as the stairways.  Check any carpeting to make sure that it is firmly attached to the stairs. Fix any carpet that is loose or worn.  Avoid having throw rugs at the top or bottom of the stairs. If you do have throw rugs, attach them to the floor with carpet tape.  Make sure that you have a light switch at the top of the stairs and the bottom of the stairs. If you do not have them, ask someone to add them for you. What else can I do to help prevent falls?  Wear shoes that:  Do not have high heels.  Have rubber bottoms.  Are comfortable and fit you well.  Are closed at the toe. Do not wear sandals.  If you use a stepladder:  Make sure that it is fully  opened. Do not climb a closed stepladder.  Make sure that both sides of the stepladder are locked into place.  Ask someone to hold it for you, if possible.  Clearly mark and make sure that you can see:  Any grab bars or handrails.  First and last steps.  Where the edge of each step is.  Use tools that help you move around (mobility aids) if they are needed. These include:  Canes.  Walkers.  Scooters.  Crutches.  Turn on the lights when you go into a dark area. Replace any light bulbs as soon as they burn out.  Set up your furniture so you have a clear path. Avoid moving your furniture around.  If any of your floors are uneven, fix them.  If there are any pets around you, be aware of where they are.  Review your medicines with your doctor. Some medicines can make you feel dizzy. This can increase your chance of falling. Ask your doctor what other things that you can do to help prevent falls. This information is not intended to replace advice given to you by your health care provider. Make sure you discuss any questions you have with your health care provider. Document Released: 05/19/2009 Document Revised: 12/29/2015 Document Reviewed: 08/27/2014 Elsevier Interactive Patient Education  2017 [employer].

## 2020-10-14 NOTE — Telephone Encounter (Signed)
Patient states she needs a handicap placard and she will pick the form up once Dr. Sarajane Jews has signed it.  Please advise.

## 2020-10-19 NOTE — Telephone Encounter (Signed)
Pt Placard has been completed and is ready for pick up, Pt has been notified to pick up form in the front office. Copy of the form sent to scanning

## 2021-05-04 ENCOUNTER — Encounter: Payer: Self-pay | Admitting: Gastroenterology

## 2021-06-27 ENCOUNTER — Other Ambulatory Visit: Payer: Self-pay | Admitting: Family Medicine

## 2021-06-27 DIAGNOSIS — Z1231 Encounter for screening mammogram for malignant neoplasm of breast: Secondary | ICD-10-CM

## 2021-07-28 ENCOUNTER — Encounter: Payer: Self-pay | Admitting: Family Medicine

## 2021-07-28 ENCOUNTER — Ambulatory Visit (INDEPENDENT_AMBULATORY_CARE_PROVIDER_SITE_OTHER): Payer: Medicare Other

## 2021-07-28 ENCOUNTER — Ambulatory Visit (INDEPENDENT_AMBULATORY_CARE_PROVIDER_SITE_OTHER): Payer: Medicare Other | Admitting: Family Medicine

## 2021-07-28 ENCOUNTER — Other Ambulatory Visit: Payer: Self-pay

## 2021-07-28 VITALS — BP 110/78 | HR 84 | Temp 98.4°F | Ht 65.5 in | Wt 149.0 lb

## 2021-07-28 DIAGNOSIS — R7989 Other specified abnormal findings of blood chemistry: Secondary | ICD-10-CM | POA: Diagnosis not present

## 2021-07-28 DIAGNOSIS — R221 Localized swelling, mass and lump, neck: Secondary | ICD-10-CM | POA: Diagnosis not present

## 2021-07-28 DIAGNOSIS — D869 Sarcoidosis, unspecified: Secondary | ICD-10-CM

## 2021-07-28 DIAGNOSIS — Z0000 Encounter for general adult medical examination without abnormal findings: Secondary | ICD-10-CM | POA: Diagnosis not present

## 2021-07-28 LAB — LIPID PANEL
Cholesterol: 156 mg/dL (ref 0–200)
HDL: 48.9 mg/dL (ref >39.00)
NonHDL: 107.14
Total CHOL/HDL Ratio: 3
Triglycerides: 388 mg/dL — ABNORMAL HIGH (ref 0.0–149.0)
VLDL: 77.6 mg/dL — ABNORMAL HIGH (ref 0.0–40.0)

## 2021-07-28 LAB — BASIC METABOLIC PANEL
BUN: 8 mg/dL (ref 6–23)
CO2: 23 mEq/L (ref 19–32)
Calcium: 9.3 mg/dL (ref 8.4–10.5)
Chloride: 103 mEq/L (ref 96–112)
Creatinine, Ser: 0.64 mg/dL (ref 0.40–1.20)
GFR: 95.47 mL/min (ref >60.00)
Glucose, Bld: 83 mg/dL (ref 70–99)
Potassium: 3.4 mEq/L — ABNORMAL LOW (ref 3.5–5.1)
Sodium: 139 mEq/L (ref 135–145)

## 2021-07-28 LAB — HEPATIC FUNCTION PANEL
ALT: 30 U/L (ref 0–35)
AST: 61 U/L — ABNORMAL HIGH (ref 0–37)
Albumin: 3.8 g/dL (ref 3.5–5.2)
Alkaline Phosphatase: 94 U/L (ref 39–117)
Bilirubin, Direct: 0.2 mg/dL (ref 0.0–0.3)
Total Bilirubin: 1 mg/dL (ref 0.2–1.2)
Total Protein: 7.8 g/dL (ref 6.0–8.3)

## 2021-07-28 LAB — CBC WITH DIFFERENTIAL/PLATELET
Basophils Absolute: 0.1 10*3/uL (ref 0.0–0.1)
Basophils Relative: 1.5 % (ref 0.0–3.0)
Eosinophils Absolute: 0.1 10*3/uL (ref 0.0–0.7)
Eosinophils Relative: 1.5 % (ref 0.0–5.0)
HCT: 41.4 % (ref 36.0–46.0)
Hemoglobin: 14.3 g/dL (ref 12.0–15.0)
Lymphocytes Relative: 25.2 % (ref 12.0–46.0)
Lymphs Abs: 1.6 10*3/uL (ref 0.7–4.0)
MCHC: 34.6 g/dL (ref 30.0–36.0)
MCV: 104.6 fl — ABNORMAL HIGH (ref 78.0–100.0)
Monocytes Absolute: 0.5 10*3/uL (ref 0.1–1.0)
Monocytes Relative: 8 % (ref 3.0–12.0)
Neutro Abs: 4 10*3/uL (ref 1.4–7.7)
Neutrophils Relative %: 63.8 % (ref 43.0–77.0)
Platelets: 218 10*3/uL (ref 150.0–400.0)
RBC: 3.96 Mil/uL (ref 3.87–5.11)
RDW: 13.6 % (ref 11.5–15.5)
WBC: 6.2 10*3/uL (ref 4.0–10.5)

## 2021-07-28 LAB — HEMOGLOBIN A1C: Hgb A1c MFr Bld: 5.1 % (ref 4.6–6.5)

## 2021-07-28 LAB — LDL CHOLESTEROL, DIRECT: Direct LDL: 62 mg/dL

## 2021-07-28 LAB — TSH: TSH: 3.13 u[IU]/mL (ref 0.35–5.50)

## 2021-07-28 NOTE — Progress Notes (Signed)
Subjective:    Patient ID: Stephanie Schmidt, female    DOB: 05-13-1961, 60 y.o.   MRN: [id number]  HPI Here for a well exam. She feels great. She eats a healthy diet and she has lost 50 lbs in the past 4 years. We have been monitoring a lump in the right side of the neck for the past several years. This does not bother her and it has not changed in size at all.    Review of Systems  Constitutional: Negative.   HENT: Negative.    Eyes: Negative.   Respiratory: Negative.    Cardiovascular: Negative.   Gastrointestinal: Negative.   Genitourinary:  Negative for decreased urine volume, difficulty urinating, dyspareunia, dysuria, enuresis, flank pain, frequency, hematuria, pelvic pain and urgency.  Musculoskeletal: Negative.   Skin: Negative.   Neurological: Negative.  Negative for headaches.  Psychiatric/Behavioral: Negative.        Objective:   Physical Exam Constitutional:      General: She is not in acute distress.    Appearance: Normal appearance. She is well-developed.  HENT:     Head: Normocephalic and atraumatic.     Right Ear: External ear normal.     Left Ear: External ear normal.     Nose: Nose normal.     Mouth/Throat:     Pharynx: No oropharyngeal exudate.  Eyes:     General: No scleral icterus.    Conjunctiva/sclera: Conjunctivae normal.     Pupils: Pupils are equal, round, and reactive to light.  Neck:     Thyroid: No thyromegaly.     Vascular: No JVD.     Comments: Single 1.5 cm firm mobile non-tender lump in the right neck just inferior to the mandible  Cardiovascular:     Rate and Rhythm: Normal rate and regular rhythm.     Heart sounds: Normal heart sounds. No murmur heard.   No friction rub. No gallop.  Pulmonary:     Effort: Pulmonary effort is normal. No respiratory distress.     Breath sounds: Normal breath sounds. No wheezing or rales.  Chest:     Chest wall: No tenderness.  Abdominal:     General: Bowel sounds are normal. There is no distension.      Palpations: Abdomen is soft. There is no mass.     Tenderness: There is no abdominal tenderness. There is no guarding or rebound.  Musculoskeletal:        General: No tenderness. Normal range of motion.     Cervical back: Normal range of motion and neck supple.  Skin:    General: Skin is warm and dry.     Findings: No erythema or rash.  Neurological:     Mental Status: She is alert and oriented to person, place, and time.     Cranial Nerves: No cranial nerve deficit.     Motor: No abnormal muscle tone.     Coordination: Coordination normal.     Deep Tendon Reflexes: Reflexes are normal and symmetric. Reflexes normal.  Psychiatric:        Behavior: Behavior normal.        Thought Content: Thought content normal.        Judgment: Judgment normal.          Assessment & Plan:  Well exam. We discussed diet and exercise. Get fasting labs. We will refer her to ENT to evaluate the neck lump. Set up another colonoscopy. Get a CXR to follow the pulmonary sarcoidosis. Annie Main  Sarajane Jews, MD

## 2021-08-04 ENCOUNTER — Ambulatory Visit
Admission: RE | Admit: 2021-08-04 | Discharge: 2021-08-04 | Disposition: A | Discharge status: Home / Self Care | Payer: Medicare Other | Source: Ambulatory Visit | Attending: Family Medicine | Admitting: Family Medicine

## 2021-08-04 DIAGNOSIS — Z1231 Encounter for screening mammogram for malignant neoplasm of breast: Secondary | ICD-10-CM | POA: Diagnosis not present

## 2021-10-16 ENCOUNTER — Ambulatory Visit: Payer: Medicare Other

## 2021-11-02 ENCOUNTER — Telehealth: Payer: Self-pay

## 2021-11-02 NOTE — Telephone Encounter (Signed)
Unsuccessful attempt to reach patient on preferred number listed in notes for scheduled AWV. Unable to leave message on voicemail. 

## 2021-11-07 ENCOUNTER — Ambulatory Visit (INDEPENDENT_AMBULATORY_CARE_PROVIDER_SITE_OTHER): Payer: Medicare Other

## 2021-11-07 VITALS — Ht 66.0 in | Wt 149.0 lb

## 2021-11-07 DIAGNOSIS — Z0000 Encounter for general adult medical examination without abnormal findings: Secondary | ICD-10-CM
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
## 2021-11-07 NOTE — Patient Instructions (Addendum)
?Stephanie Schmidt , ?Thank you for taking time to come for your Medicare Wellness Visit. I appreciate your ongoing commitment to your health goals. Please review the following plan we discussed and let me know if I can assist you in the future.  ? ?These are the goals we discussed: ? Goals   ? ?   Patient Stated (pt-stated)   ?   I would like to just keep living. ?  ? ?  ?  ?This is a list of the screening recommended for you and due dates:  ?Health Maintenance  ?Topic Date Due  ? COVID-19 Vaccine (4 - Booster for Moderna series) 11/23/2021*  ? Zoster (Shingles) Vaccine (2 of 2) 02/06/2022*  ? Colon Cancer Screening  11/08/2022*  ? HIV Screening  11/08/2022*  ? Flu Shot  03/06/2022  ? Pap Smear  08/05/2022  ? Mammogram  08/05/2023  ? Tetanus Vaccine  04/25/2028  ? Hepatitis C Screening: USPSTF Recommendation to screen - Ages 16-79 yo.  Completed  ? HPV Vaccine  Aged Out  ?*Topic was postponed. The date shown is not the original due date.  ? ?Advanced directives: No ? ?Conditions/risks identified: None ? ?Next appointment: Follow up in one year for your annual wellness visit  ? ? ?Preventive Care 62 Years and Older, Female ?Preventive care refers to lifestyle choices and visits with your health care provider that can promote health and wellness. ?What does preventive care include? ?A yearly physical exam. This is also called an annual well check. ?Dental exams once or twice a year. ?Routine eye exams. Ask your health care provider how often you should have your eyes checked. ?Personal lifestyle choices, including: ?Daily care of your teeth and gums. ?Regular physical activity. ?Eating a healthy diet. ?Avoiding tobacco and drug use. ?Limiting alcohol use. ?Practicing safe sex. ?Taking low-dose aspirin every day. ?Taking vitamin and mineral supplements as recommended by your health care provider. ?What happens during an annual well check? ?The services and screenings done by your health care provider during your annual well  check will depend on your age, overall health, lifestyle risk factors, and family history of disease. ?Counseling  ?Your health care provider may ask you questions about your: ?Alcohol use. ?Tobacco use. ?Drug use. ?Emotional well-being. ?Home and relationship well-being. ?Sexual activity. ?Eating habits. ?History of falls. ?Memory and ability to understand (cognition). ?Work and work Statistician. ?Reproductive health. ?Screening  ?You may have the following tests or measurements: ?Height, weight, and BMI. ?Blood pressure. ?Lipid and cholesterol levels. These may be checked every 5 years, or more frequently if you are over 49 years old. ?Skin check. ?Lung cancer screening. You may have this screening every year starting at age 56 if you have a 30-pack-year history of smoking and currently smoke or have quit within the past 15 years. ?Fecal occult blood test (FOBT) of the stool. You may have this test every year starting at age 77. ?Flexible sigmoidoscopy or colonoscopy. You may have a sigmoidoscopy every 5 years or a colonoscopy every 10 years starting at age 8. ?Hepatitis C blood test. ?Hepatitis B blood test. ?Sexually transmitted disease (STD) testing. ?Diabetes screening. This is done by checking your blood sugar (glucose) after you have not eaten for a while (fasting). You may have this done every 1-3 years. ?Bone density scan. This is done to screen for osteoporosis. You may have this done starting at age 56. ?Mammogram. This may be done every 1-2 years. Talk to your health care provider about how often  you should have regular mammograms. ?Talk with your health care provider about your test results, treatment options, and if necessary, the need for more tests. ?Vaccines  ?Your health care provider may recommend certain vaccines, such as: ?Influenza vaccine. This is recommended every year. ?Tetanus, diphtheria, and acellular pertussis (Tdap, Td) vaccine. You may need a Td booster every 10 years. ?Zoster  vaccine. You may need this after age 66. ?Pneumococcal 13-valent conjugate (PCV13) vaccine. One dose is recommended after age 54. ?Pneumococcal polysaccharide (PPSV23) vaccine. One dose is recommended after age 12. ?Talk to your health care provider about which screenings and vaccines you need and how often you need them. ?This information is not intended to replace advice given to you by your health care provider. Make sure you discuss any questions you have with your health care provider. ?Document Released: 08/19/2015 Document Revised: 04/11/2016 Document Reviewed: 05/24/2015 ?Elsevier Interactive Patient Education ? 2017 [place]. ? ?Fall Prevention in the Home ?Falls can cause injuries. They can happen to people of all ages. There are many things you can do to make your home safe and to help prevent falls. ?What can I do on the outside of my home? ?Regularly fix the edges of walkways and driveways and fix any cracks. ?Remove anything that might make you trip as you walk through a door, such as a raised step or threshold. ?Trim any bushes or trees on the path to your home. ?Use bright outdoor lighting. ?Clear any walking paths of anything that might make someone trip, such as rocks or tools. ?Regularly check to see if handrails are loose or broken. Make sure that both sides of any steps have handrails. ?Any raised decks and porches should have guardrails on the edges. ?Have any leaves, snow, or ice cleared regularly. ?Use sand or salt on walking paths during winter. ?Clean up any spills in your garage right away. This includes oil or grease spills. ?What can I do in the bathroom? ?Use night lights. ?Install grab bars by the toilet and in the tub and shower. Do not use towel bars as grab bars. ?Use non-skid mats or decals in the tub or shower. ?If you need to sit down in the shower, use a plastic, non-slip stool. ?Keep the floor dry. Clean up any water that spills on the floor as soon as it happens. ?Remove  soap buildup in the tub or shower regularly. ?Attach bath mats securely with double-sided non-slip rug tape. ?Do not have throw rugs and other things on the floor that can make you trip. ?What can I do in the bedroom? ?Use night lights. ?Make sure that you have a light by your bed that is easy to reach. ?Do not use any sheets or blankets that are too big for your bed. They should not hang down onto the floor. ?Have a firm chair that has side arms. You can use this for support while you get dressed. ?Do not have throw rugs and other things on the floor that can make you trip. ?What can I do in the kitchen? ?Clean up any spills right away. ?Avoid walking on wet floors. ?Keep items that you use a lot in easy-to-reach places. ?If you need to reach something above you, use a strong step stool that has a grab bar. ?Keep electrical cords out of the way. ?Do not use floor polish or wax that makes floors slippery. If you must use wax, use non-skid floor wax. ?Do not have throw rugs and other things on  the floor that can make you trip. ?What can I do with my stairs? ?Do not leave any items on the stairs. ?Make sure that there are handrails on both sides of the stairs and use them. Fix handrails that are broken or loose. Make sure that handrails are as long as the stairways. ?Check any carpeting to make sure that it is firmly attached to the stairs. Fix any carpet that is loose or worn. ?Avoid having throw rugs at the top or bottom of the stairs. If you do have throw rugs, attach them to the floor with carpet tape. ?Make sure that you have a light switch at the top of the stairs and the bottom of the stairs. If you do not have them, ask someone to add them for you. ?What else can I do to help prevent falls? ?Wear shoes that: ?Do not have high heels. ?Have rubber bottoms. ?Are comfortable and fit you well. ?Are closed at the toe. Do not wear sandals. ?If you use a stepladder: ?Make sure that it is fully opened. Do not climb a  closed stepladder. ?Make sure that both sides of the stepladder are locked into place. ?Ask someone to hold it for you, if possible. ?Clearly mark and make sure that you can see: ?Any grab bars or handrails.

## 2022-11-09 ENCOUNTER — Ambulatory Visit (INDEPENDENT_AMBULATORY_CARE_PROVIDER_SITE_OTHER): Payer: Medicare Other

## 2022-11-09 VITALS — Ht 65.5 in | Wt 138.0 lb

## 2022-11-09 DIAGNOSIS — Z1211 Encounter for screening for malignant neoplasm of colon: Secondary | ICD-10-CM

## 2022-11-09 DIAGNOSIS — Z0000 Encounter for general adult medical examination without abnormal findings: Secondary | ICD-10-CM | POA: Diagnosis not present

## 2022-11-09 NOTE — Patient Instructions (Addendum)
Stephanie Schmidt , Thank you for taking time to come for your Medicare Wellness Visit. I appreciate your ongoing commitment to your health goals. Please review the following plan we discussed and let me know if I can assist you in the future.   These are the goals we discussed:  Goals       Patient Stated (pt-stated)      I would like to just keep living.        This is a list of the screening recommended for you and due dates:  Health Maintenance  Topic Date Due   Pap Smear  08/05/2022   COVID-19 Vaccine (4 - 2023-24 season) 11/25/2022*   Zoster (Shingles) Vaccine (2 of 2) 02/08/2023*   Colon Cancer Screening  11/09/2023*   HIV Screening  11/09/2023*   Flu Shot  03/07/2023   Mammogram  08/05/2023   Medicare Annual Wellness Visit  11/09/2023   DTaP/Tdap/Td vaccine (2 - Td or Tdap) 04/25/2028   Hepatitis C Screening: USPSTF Recommendation to screen - Ages 24-79 yo.  Completed   HPV Vaccine  Aged Out  *Topic was postponed. The date shown is not the original due date.    Advanced directives: Advance directive discussed with you today. Even though you declined this today, please call our office should you change your mind, and we can give you the proper paperwork for you to fill out.   Conditions/risks identified: None  Next appointment: Follow up in one year for your annual wellness visit.    Preventive Care 40-64 Years, Female Preventive care refers to lifestyle choices and visits with your health care provider that can promote health and wellness. What does preventive care include? A yearly physical exam. This is also called an annual well check. Dental exams once or twice a year. Routine eye exams. Ask your health care provider how often you should have your eyes checked. Personal lifestyle choices, including: Daily care of your teeth and gums. Regular physical activity. Eating a healthy diet. Avoiding tobacco and drug use. Limiting alcohol use. Practicing safe sex. Taking  low-dose aspirin daily starting at age 66. Taking vitamin and mineral supplements as recommended by your health care provider. What happens during an annual well check? The services and screenings done by your health care provider during your annual well check will depend on your age, overall health, lifestyle risk factors, and family history of disease. Counseling  Your health care provider may ask you questions about your: Alcohol use. Tobacco use. Drug use. Emotional well-being. Home and relationship well-being. Sexual activity. Eating habits. Work and work Astronomer. Method of birth control. Menstrual cycle. Pregnancy history. Screening  You may have the following tests or measurements: Height, weight, and BMI. Blood pressure. Lipid and cholesterol levels. These may be checked every 5 years, or more frequently if you are over 64 years old. Skin check. Lung cancer screening. You may have this screening every year starting at age 20 if you have a 30-pack-year history of smoking and currently smoke or have quit within the past 15 years. Fecal occult blood test (FOBT) of the stool. You may have this test every year starting at age 35. Flexible sigmoidoscopy or colonoscopy. You may have a sigmoidoscopy every 5 years or a colonoscopy every 10 years starting at age 70. Hepatitis C blood test. Hepatitis B blood test. Sexually transmitted disease (STD) testing. Diabetes screening. This is done by checking your blood sugar (glucose) after you have not eaten for a while (fasting). You  may have this done every 1-3 years. Mammogram. This may be done every 1-2 years. Talk to your health care provider about when you should start having regular mammograms. This may depend on whether you have a family history of breast cancer. BRCA-related cancer screening. This may be done if you have a family history of breast, ovarian, tubal, or peritoneal cancers. Pelvic exam and Pap test. This may be done  every 3 years starting at age 63. Starting at age 63, this may be done every 5 years if you have a Pap test in combination with an HPV test. Bone density scan. This is done to screen for osteoporosis. You may have this scan if you are at high risk for osteoporosis. Discuss your test results, treatment options, and if necessary, the need for more tests with your health care provider. Vaccines  Your health care provider may recommend certain vaccines, such as: Influenza vaccine. This is recommended every year. Tetanus, diphtheria, and acellular pertussis (Tdap, Td) vaccine. You may need a Td booster every 10 years. Zoster vaccine. You may need this after age 63. Pneumococcal 13-valent conjugate (PCV13) vaccine. You may need this if you have certain conditions and were not previously vaccinated. Pneumococcal polysaccharide (PPSV23) vaccine. You may need one or two doses if you smoke cigarettes or if you have certain conditions. Talk to your health care provider about which screenings and vaccines you need and how often you need them. This information is not intended to replace advice given to you by your health care provider. Make sure you discuss any questions you have with your health care provider. Document Released: 08/19/2015 Document Revised: 04/11/2016 Document Reviewed: 05/24/2015 Elsevier Interactive Patient Education  2017 [employer]    Fall Prevention in the Home Falls can cause injuries. They can happen to people of all ages. There are many things you can do to make your home safe and to help prevent falls. What can I do on the outside of my home? Regularly fix the edges of walkways and driveways and fix any cracks. Remove anything that might make you trip as you walk through a door, such as a raised step or threshold. Trim any bushes or trees on the path to your home. Use bright outdoor lighting. Clear any walking paths of anything that might make someone trip, such as rocks or  tools. Regularly check to see if handrails are loose or broken. Make sure that both sides of any steps have handrails. Any raised decks and porches should have guardrails on the edges. Have any leaves, snow, or ice cleared regularly. Use sand or salt on walking paths during winter. Clean up any spills in your garage right away. This includes oil or grease spills. What can I do in the bathroom? Use night lights. Install grab bars by the toilet and in the tub and shower. Do not use towel bars as grab bars. Use non-skid mats or decals in the tub or shower. If you need to sit down in the shower, use a plastic, non-slip stool. Keep the floor dry. Clean up any water that spills on the floor as soon as it happens. Remove soap buildup in the tub or shower regularly. Attach bath mats securely with double-sided non-slip rug tape. Do not have throw rugs and other things on the floor that can make you trip. What can I do in the bedroom? Use night lights. Make sure that you have a light by your bed that is easy to reach. Do  not use any sheets or blankets that are too big for your bed. They should not hang down onto the floor. Have a firm chair that has side arms. You can use this for support while you get dressed. Do not have throw rugs and other things on the floor that can make you trip. What can I do in the kitchen? Clean up any spills right away. Avoid walking on wet floors. Keep items that you use a lot in easy-to-reach places. If you need to reach something above you, use a strong step stool that has a grab bar. Keep electrical cords out of the way. Do not use floor polish or wax that makes floors slippery. If you must use wax, use non-skid floor wax. Do not have throw rugs and other things on the floor that can make you trip. What can I do with my stairs? Do not leave any items on the stairs. Make sure that there are handrails on both sides of the stairs and use them. Fix handrails that are  broken or loose. Make sure that handrails are as long as the stairways. Check any carpeting to make sure that it is firmly attached to the stairs. Fix any carpet that is loose or worn. Avoid having throw rugs at the top or bottom of the stairs. If you do have throw rugs, attach them to the floor with carpet tape. Make sure that you have a light switch at the top of the stairs and the bottom of the stairs. If you do not have them, ask someone to add them for you. What else can I do to help prevent falls? Wear shoes that: Do not have high heels. Have rubber bottoms. Are comfortable and fit you well. Are closed at the toe. Do not wear sandals. If you use a stepladder: Make sure that it is fully opened. Do not climb a closed stepladder. Make sure that both sides of the stepladder are locked into place. Ask someone to hold it for you, if possible. Clearly mark and make sure that you can see: Any grab bars or handrails. First and last steps. Where the edge of each step is. Use tools that help you move around (mobility aids) if they are needed. These include: Canes. Walkers. Scooters. Crutches. Turn on the lights when you go into a dark area. Replace any light bulbs as soon as they burn out. Set up your furniture so you have a clear path. Avoid moving your furniture around. If any of your floors are uneven, fix them. If there are any pets around you, be aware of where they are. Review your medicines with your doctor. Some medicines can make you feel dizzy. This can increase your chance of falling. Ask your doctor what other things that you can do to help prevent falls. This information is not intended to replace advice given to you by your health care provider. Make sure you discuss any questions you have with your health care provider. Document Released: 05/19/2009 Document Revised: 12/29/2015 Document Reviewed: 08/27/2014 Elsevier Interactive Patient Education  2017 [employer].
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
## 2022-12-10 ENCOUNTER — Encounter: Payer: Self-pay | Admitting: Family Medicine

## 2023-09-18 ENCOUNTER — Telehealth: Payer: Self-pay

## 2023-09-18 NOTE — Telephone Encounter (Signed)
Copied from CRM [id number]. Topic: General - Other >> Sep 18, 2023 12:35 PM Rodman Pickle T wrote: Reason for CRM: patient is requesting a dr's note she has a appt scheduled for feb 18th but she needs the dr note before she is seen due to the swelling in her feet she has been put due to this swelling I her feet she would like a call back regarding this

## 2023-09-24 ENCOUNTER — Ambulatory Visit: Payer: Medicare Other | Admitting: Family Medicine

## 2023-10-01 NOTE — Telephone Encounter (Signed)
 Left pt a message regarding this, advised to call the office with more info

## 2023-10-03 NOTE — Telephone Encounter (Signed)
 Attempted to contact pt several times phone message my call cannot be completed at this time.Pt is not active on MyChart to send a message.

## 2023-10-06 ENCOUNTER — Emergency Department (HOSPITAL_COMMUNITY)

## 2023-10-06 ENCOUNTER — Inpatient Hospital Stay (HOSPITAL_COMMUNITY)
Admission: EM | Admit: 2023-10-06 | Discharge: 2023-11-05 | DRG: 432 | Disposition: E | Attending: Pulmonary Disease | Admitting: Pulmonary Disease

## 2023-10-06 ENCOUNTER — Other Ambulatory Visit: Payer: Self-pay

## 2023-10-06 ENCOUNTER — Encounter (HOSPITAL_COMMUNITY): Payer: Self-pay | Admitting: Emergency Medicine

## 2023-10-06 DIAGNOSIS — R579 Shock, unspecified: Secondary | ICD-10-CM | POA: Diagnosis present

## 2023-10-06 DIAGNOSIS — D6489 Other specified anemias: Secondary | ICD-10-CM | POA: Diagnosis present

## 2023-10-06 DIAGNOSIS — R404 Transient alteration of awareness: Secondary | ICD-10-CM | POA: Diagnosis not present

## 2023-10-06 DIAGNOSIS — I428 Other cardiomyopathies: Secondary | ICD-10-CM | POA: Diagnosis not present

## 2023-10-06 DIAGNOSIS — G9340 Encephalopathy, unspecified: Secondary | ICD-10-CM | POA: Diagnosis not present

## 2023-10-06 DIAGNOSIS — K760 Fatty (change of) liver, not elsewhere classified: Secondary | ICD-10-CM | POA: Diagnosis present

## 2023-10-06 DIAGNOSIS — Z1152 Encounter for screening for COVID-19: Secondary | ICD-10-CM

## 2023-10-06 DIAGNOSIS — J439 Emphysema, unspecified: Secondary | ICD-10-CM | POA: Diagnosis not present

## 2023-10-06 DIAGNOSIS — R188 Other ascites: Secondary | ICD-10-CM | POA: Diagnosis not present

## 2023-10-06 DIAGNOSIS — Z881 Allergy status to other antibiotic agents status: Secondary | ICD-10-CM

## 2023-10-06 DIAGNOSIS — K7290 Hepatic failure, unspecified without coma: Secondary | ICD-10-CM

## 2023-10-06 DIAGNOSIS — K7011 Alcoholic hepatitis with ascites: Principal | ICD-10-CM | POA: Diagnosis present

## 2023-10-06 DIAGNOSIS — D696 Thrombocytopenia, unspecified: Secondary | ICD-10-CM | POA: Diagnosis present

## 2023-10-06 DIAGNOSIS — R935 Abnormal findings on diagnostic imaging of other abdominal regions, including retroperitoneum: Secondary | ICD-10-CM | POA: Diagnosis not present

## 2023-10-06 DIAGNOSIS — J984 Other disorders of lung: Secondary | ICD-10-CM | POA: Diagnosis not present

## 2023-10-06 DIAGNOSIS — N179 Acute kidney failure, unspecified: Secondary | ICD-10-CM | POA: Diagnosis not present

## 2023-10-06 DIAGNOSIS — F1010 Alcohol abuse, uncomplicated: Secondary | ICD-10-CM | POA: Diagnosis not present

## 2023-10-06 DIAGNOSIS — Z79899 Other long term (current) drug therapy: Secondary | ICD-10-CM

## 2023-10-06 DIAGNOSIS — R410 Disorientation, unspecified: Secondary | ICD-10-CM | POA: Diagnosis not present

## 2023-10-06 DIAGNOSIS — J9601 Acute respiratory failure with hypoxia: Secondary | ICD-10-CM | POA: Diagnosis not present

## 2023-10-06 DIAGNOSIS — K7201 Acute and subacute hepatic failure with coma: Secondary | ICD-10-CM | POA: Diagnosis present

## 2023-10-06 DIAGNOSIS — Z66 Do not resuscitate: Secondary | ICD-10-CM | POA: Diagnosis present

## 2023-10-06 DIAGNOSIS — K7200 Acute and subacute hepatic failure without coma: Principal | ICD-10-CM

## 2023-10-06 DIAGNOSIS — D860 Sarcoidosis of lung: Secondary | ICD-10-CM | POA: Diagnosis present

## 2023-10-06 DIAGNOSIS — Z8249 Family history of ischemic heart disease and other diseases of the circulatory system: Secondary | ICD-10-CM

## 2023-10-06 DIAGNOSIS — R64 Cachexia: Secondary | ICD-10-CM | POA: Diagnosis not present

## 2023-10-06 DIAGNOSIS — R918 Other nonspecific abnormal finding of lung field: Secondary | ICD-10-CM | POA: Diagnosis not present

## 2023-10-06 DIAGNOSIS — F10229 Alcohol dependence with intoxication, unspecified: Secondary | ICD-10-CM | POA: Diagnosis present

## 2023-10-06 DIAGNOSIS — D689 Coagulation defect, unspecified: Secondary | ICD-10-CM | POA: Diagnosis not present

## 2023-10-06 DIAGNOSIS — K429 Umbilical hernia without obstruction or gangrene: Secondary | ICD-10-CM | POA: Diagnosis present

## 2023-10-06 DIAGNOSIS — E43 Unspecified severe protein-calorie malnutrition: Secondary | ICD-10-CM | POA: Diagnosis not present

## 2023-10-06 DIAGNOSIS — R000 Tachycardia, unspecified: Secondary | ICD-10-CM | POA: Diagnosis not present

## 2023-10-06 DIAGNOSIS — J690 Pneumonitis due to inhalation of food and vomit: Secondary | ICD-10-CM | POA: Diagnosis not present

## 2023-10-06 DIAGNOSIS — R569 Unspecified convulsions: Secondary | ICD-10-CM | POA: Diagnosis not present

## 2023-10-06 DIAGNOSIS — E871 Hypo-osmolality and hyponatremia: Secondary | ICD-10-CM | POA: Diagnosis not present

## 2023-10-06 DIAGNOSIS — Z860101 Personal history of adenomatous and serrated colon polyps: Secondary | ICD-10-CM

## 2023-10-06 DIAGNOSIS — Z681 Body mass index (BMI) 19 or less, adult: Secondary | ICD-10-CM

## 2023-10-06 DIAGNOSIS — Z83438 Family history of other disorder of lipoprotein metabolism and other lipidemia: Secondary | ICD-10-CM

## 2023-10-06 DIAGNOSIS — J90 Pleural effusion, not elsewhere classified: Secondary | ICD-10-CM | POA: Diagnosis not present

## 2023-10-06 DIAGNOSIS — J432 Centrilobular emphysema: Secondary | ICD-10-CM | POA: Diagnosis present

## 2023-10-06 DIAGNOSIS — M329 Systemic lupus erythematosus, unspecified: Secondary | ICD-10-CM | POA: Diagnosis not present

## 2023-10-06 DIAGNOSIS — E8770 Fluid overload, unspecified: Secondary | ICD-10-CM | POA: Diagnosis not present

## 2023-10-06 DIAGNOSIS — I509 Heart failure, unspecified: Secondary | ICD-10-CM | POA: Diagnosis not present

## 2023-10-06 DIAGNOSIS — K7031 Alcoholic cirrhosis of liver with ascites: Secondary | ICD-10-CM

## 2023-10-06 DIAGNOSIS — F10239 Alcohol dependence with withdrawal, unspecified: Secondary | ICD-10-CM | POA: Diagnosis not present

## 2023-10-06 DIAGNOSIS — E876 Hypokalemia: Secondary | ICD-10-CM | POA: Diagnosis present

## 2023-10-06 DIAGNOSIS — G9341 Metabolic encephalopathy: Secondary | ICD-10-CM | POA: Diagnosis not present

## 2023-10-06 DIAGNOSIS — Z7189 Other specified counseling: Secondary | ICD-10-CM | POA: Diagnosis not present

## 2023-10-06 DIAGNOSIS — R109 Unspecified abdominal pain: Secondary | ICD-10-CM | POA: Diagnosis not present

## 2023-10-06 DIAGNOSIS — E860 Dehydration: Secondary | ICD-10-CM | POA: Diagnosis present

## 2023-10-06 DIAGNOSIS — Z87891 Personal history of nicotine dependence: Secondary | ICD-10-CM

## 2023-10-06 DIAGNOSIS — Z885 Allergy status to narcotic agent status: Secondary | ICD-10-CM

## 2023-10-06 DIAGNOSIS — Z515 Encounter for palliative care: Secondary | ICD-10-CM | POA: Diagnosis not present

## 2023-10-06 DIAGNOSIS — Z9081 Acquired absence of spleen: Secondary | ICD-10-CM

## 2023-10-06 DIAGNOSIS — R627 Adult failure to thrive: Secondary | ICD-10-CM | POA: Diagnosis present

## 2023-10-06 DIAGNOSIS — Z808 Family history of malignant neoplasm of other organs or systems: Secondary | ICD-10-CM

## 2023-10-06 DIAGNOSIS — E872 Acidosis, unspecified: Secondary | ICD-10-CM | POA: Diagnosis present

## 2023-10-06 DIAGNOSIS — K7682 Hepatic encephalopathy: Secondary | ICD-10-CM | POA: Diagnosis not present

## 2023-10-06 DIAGNOSIS — K439 Ventral hernia without obstruction or gangrene: Secondary | ICD-10-CM | POA: Diagnosis present

## 2023-10-06 DIAGNOSIS — I959 Hypotension, unspecified: Secondary | ICD-10-CM

## 2023-10-06 DIAGNOSIS — K7460 Unspecified cirrhosis of liver: Secondary | ICD-10-CM | POA: Diagnosis present

## 2023-10-06 DIAGNOSIS — E2740 Unspecified adrenocortical insufficiency: Secondary | ICD-10-CM | POA: Diagnosis not present

## 2023-10-06 DIAGNOSIS — R680 Hypothermia, not associated with low environmental temperature: Secondary | ICD-10-CM | POA: Diagnosis present

## 2023-10-06 DIAGNOSIS — R040 Epistaxis: Secondary | ICD-10-CM | POA: Diagnosis not present

## 2023-10-06 DIAGNOSIS — R768 Other specified abnormal immunological findings in serum: Secondary | ICD-10-CM | POA: Diagnosis present

## 2023-10-06 DIAGNOSIS — D649 Anemia, unspecified: Secondary | ICD-10-CM | POA: Diagnosis not present

## 2023-10-06 DIAGNOSIS — R54 Age-related physical debility: Secondary | ICD-10-CM | POA: Diagnosis present

## 2023-10-06 DIAGNOSIS — Z811 Family history of alcohol abuse and dependence: Secondary | ICD-10-CM

## 2023-10-06 DIAGNOSIS — Z833 Family history of diabetes mellitus: Secondary | ICD-10-CM

## 2023-10-06 DIAGNOSIS — R17 Unspecified jaundice: Secondary | ICD-10-CM | POA: Diagnosis not present

## 2023-10-06 DIAGNOSIS — K7010 Alcoholic hepatitis without ascites: Secondary | ICD-10-CM | POA: Diagnosis not present

## 2023-10-06 DIAGNOSIS — Z4682 Encounter for fitting and adjustment of non-vascular catheter: Secondary | ICD-10-CM | POA: Diagnosis not present

## 2023-10-06 DIAGNOSIS — Z880 Allergy status to penicillin: Secondary | ICD-10-CM

## 2023-10-06 DIAGNOSIS — R4182 Altered mental status, unspecified: Secondary | ICD-10-CM | POA: Diagnosis not present

## 2023-10-06 DIAGNOSIS — J9690 Respiratory failure, unspecified, unspecified whether with hypoxia or hypercapnia: Secondary | ICD-10-CM | POA: Diagnosis not present

## 2023-10-06 DIAGNOSIS — I499 Cardiac arrhythmia, unspecified: Secondary | ICD-10-CM | POA: Diagnosis not present

## 2023-10-06 DIAGNOSIS — Z9181 History of falling: Secondary | ICD-10-CM

## 2023-10-06 DIAGNOSIS — R7989 Other specified abnormal findings of blood chemistry: Secondary | ICD-10-CM

## 2023-10-06 LAB — COMPREHENSIVE METABOLIC PANEL
ALT: 48 U/L — ABNORMAL HIGH (ref 0–44)
ALT: 46 U/L — ABNORMAL HIGH (ref 0–44)
AST: 140 U/L — ABNORMAL HIGH (ref 15–41)
AST: 129 U/L — ABNORMAL HIGH (ref 15–41)
Albumin: 1.9 g/dL — ABNORMAL LOW (ref 3.5–5.0)
Albumin: 1.7 g/dL — ABNORMAL LOW (ref 3.5–5.0)
Alkaline Phosphatase: 145 U/L — ABNORMAL HIGH (ref 38–126)
Alkaline Phosphatase: 135 U/L — ABNORMAL HIGH (ref 38–126)
Anion gap: 14 (ref 5–15)
Anion gap: 11 (ref 5–15)
BUN: 13 mg/dL (ref 8–23)
BUN: 10 mg/dL (ref 8–23)
CO2: 23 mmol/L (ref 22–32)
CO2: 26 mmol/L (ref 22–32)
Calcium: 8.5 mg/dL — ABNORMAL LOW (ref 8.9–10.3)
Calcium: 8.4 mg/dL — ABNORMAL LOW (ref 8.9–10.3)
Chloride: 88 mmol/L — ABNORMAL LOW (ref 98–111)
Chloride: 89 mmol/L — ABNORMAL LOW (ref 98–111)
Creatinine, Ser: 1.13 mg/dL — ABNORMAL HIGH (ref 0.44–1.00)
Creatinine, Ser: 0.88 mg/dL (ref 0.44–1.00)
GFR, Estimated: 55 mL/min — ABNORMAL LOW (ref >60)
GFR, Estimated: >60 mL/min (ref >60)
Glucose, Bld: 91 mg/dL (ref 70–99)
Glucose, Bld: 112 mg/dL — ABNORMAL HIGH (ref 70–99)
Potassium: 3.8 mmol/L (ref 3.5–5.1)
Potassium: 3.4 mmol/L — ABNORMAL LOW (ref 3.5–5.1)
Sodium: 125 mmol/L — ABNORMAL LOW (ref 135–145)
Sodium: 126 mmol/L — ABNORMAL LOW (ref 135–145)
Total Bilirubin: 8.6 mg/dL — ABNORMAL HIGH (ref 0.0–1.2)
Total Bilirubin: 8.4 mg/dL — ABNORMAL HIGH (ref 0.0–1.2)
Total Protein: 7.4 g/dL (ref 6.5–8.1)
Total Protein: 6.9 g/dL (ref 6.5–8.1)

## 2023-10-06 LAB — PHOSPHORUS: Phosphorus: 2.3 mg/dL — ABNORMAL LOW (ref 2.5–4.6)

## 2023-10-06 LAB — PROTIME-INR
INR: 2 — ABNORMAL HIGH (ref 0.8–1.2)
INR: 2 — ABNORMAL HIGH (ref 0.8–1.2)
Prothrombin Time: 22.5 s — ABNORMAL HIGH (ref 11.4–15.2)
Prothrombin Time: 23.1 s — ABNORMAL HIGH (ref 11.4–15.2)

## 2023-10-06 LAB — CBC WITH DIFFERENTIAL/PLATELET
Abs Immature Granulocytes: 0.05 10*3/uL (ref 0.00–0.07)
Basophils Absolute: 0 10*3/uL (ref 0.0–0.1)
Basophils Relative: 0 %
Eosinophils Absolute: 0 10*3/uL (ref 0.0–0.5)
Eosinophils Relative: 0 %
HCT: 35.8 % — ABNORMAL LOW (ref 36.0–46.0)
Hemoglobin: 12.9 g/dL (ref 12.0–15.0)
Immature Granulocytes: 1 %
Lymphocytes Relative: 10 %
Lymphs Abs: 1 10*3/uL (ref 0.7–4.0)
MCH: 41.2 pg — ABNORMAL HIGH (ref 26.0–34.0)
MCHC: 36 g/dL (ref 30.0–36.0)
MCV: 114.4 fL — ABNORMAL HIGH (ref 80.0–100.0)
Monocytes Absolute: 0.8 10*3/uL (ref 0.1–1.0)
Monocytes Relative: 8 %
Neutro Abs: 7.6 10*3/uL (ref 1.7–7.7)
Neutrophils Relative %: 81 %
Platelets: 140 10*3/uL — ABNORMAL LOW (ref 150–400)
RBC: 3.13 MIL/uL — ABNORMAL LOW (ref 3.87–5.11)
RDW: 19.6 % — ABNORMAL HIGH (ref 11.5–15.5)
WBC: 9.4 10*3/uL (ref 4.0–10.5)
nRBC: 3 % — ABNORMAL HIGH (ref 0.0–0.2)

## 2023-10-06 LAB — HEPATITIS PANEL, ACUTE
HCV Ab: NONREACTIVE
Hep A IgM: NONREACTIVE
Hep B C IgM: NONREACTIVE
Hepatitis B Surface Ag: NONREACTIVE

## 2023-10-06 LAB — RESP PANEL BY RT-PCR (RSV, FLU A&B, COVID)  RVPGX2
Influenza A by PCR: NEGATIVE
Influenza B by PCR: NEGATIVE
Resp Syncytial Virus by PCR: NEGATIVE
SARS Coronavirus 2 by RT PCR: NEGATIVE

## 2023-10-06 LAB — POCT I-STAT 7, (LYTES, BLD GAS, ICA,H+H)
Acid-Base Excess: 6 mmol/L — ABNORMAL HIGH (ref 0.0–2.0)
Bicarbonate: 27.8 mmol/L (ref 20.0–28.0)
Calcium, Ion: 1.08 mmol/L — ABNORMAL LOW (ref 1.15–1.40)
HCT: 24 % — ABNORMAL LOW (ref 36.0–46.0)
Hemoglobin: 8.2 g/dL — ABNORMAL LOW (ref 12.0–15.0)
O2 Saturation: 72 %
Patient temperature: 97.2
Potassium: 3.5 mmol/L (ref 3.5–5.1)
Sodium: 128 mmol/L — ABNORMAL LOW (ref 135–145)
TCO2: 29 mmol/L (ref 22–32)
pCO2 arterial: 27.8 mmHg — ABNORMAL LOW (ref 32–48)
pH, Arterial: 7.606 (ref 7.35–7.45)
pO2, Arterial: 29 mmHg — CL (ref 83–108)

## 2023-10-06 LAB — LACTIC ACID, PLASMA: Lactic Acid, Venous: 3.3 mmol/L (ref 0.5–1.9)

## 2023-10-06 LAB — TSH
TSH: 1.064 u[IU]/mL (ref 0.350–4.500)
TSH: 5.166 u[IU]/mL — ABNORMAL HIGH (ref 0.350–4.500)

## 2023-10-06 LAB — PROCALCITONIN: Procalcitonin: 0.3 ng/mL

## 2023-10-06 LAB — CBC
HCT: 21.8 % — ABNORMAL LOW (ref 36.0–46.0)
Hemoglobin: 8 g/dL — ABNORMAL LOW (ref 12.0–15.0)
MCH: 41.9 pg — ABNORMAL HIGH (ref 26.0–34.0)
MCHC: 36.7 g/dL — ABNORMAL HIGH (ref 30.0–36.0)
MCV: 114.1 fL — ABNORMAL HIGH (ref 80.0–100.0)
Platelets: 141 10*3/uL — ABNORMAL LOW (ref 150–400)
RBC: 1.91 MIL/uL — ABNORMAL LOW (ref 3.87–5.11)
RDW: 19.8 % — ABNORMAL HIGH (ref 11.5–15.5)
WBC: 14.2 10*3/uL — ABNORMAL HIGH (ref 4.0–10.5)
nRBC: 1.5 % — ABNORMAL HIGH (ref 0.0–0.2)

## 2023-10-06 LAB — GLUCOSE, CAPILLARY
Glucose-Capillary: 111 mg/dL — ABNORMAL HIGH (ref 70–99)
Glucose-Capillary: 128 mg/dL — ABNORMAL HIGH (ref 70–99)
Glucose-Capillary: 136 mg/dL — ABNORMAL HIGH (ref 70–99)

## 2023-10-06 LAB — MRSA NEXT GEN BY PCR, NASAL: MRSA by PCR Next Gen: NOT DETECTED

## 2023-10-06 LAB — CORTISOL: Cortisol, Plasma: 16.5 ug/dL

## 2023-10-06 LAB — AMMONIA
Ammonia: 19 umol/L (ref 9–35)
Ammonia: 53 umol/L — ABNORMAL HIGH (ref 9–35)

## 2023-10-06 LAB — ACETAMINOPHEN LEVEL: Acetaminophen (Tylenol), Serum: <10 ug/mL — ABNORMAL LOW (ref 10–30)

## 2023-10-06 LAB — ETHANOL: Alcohol, Ethyl (B): <10 mg/dL (ref <10)

## 2023-10-06 LAB — LIPASE, BLOOD: Lipase: 31 U/L (ref 11–51)

## 2023-10-06 LAB — I-STAT CG4 LACTIC ACID, ED: Lactic Acid, Venous: >15 mmol/L (ref 0.5–1.9)

## 2023-10-06 LAB — AMYLASE: Amylase: 40 U/L (ref 28–100)

## 2023-10-06 LAB — MAGNESIUM: Magnesium: 1.3 mg/dL — ABNORMAL LOW (ref 1.7–2.4)

## 2023-10-06 MED ORDER — POTASSIUM CHLORIDE 10 MEQ/100ML IV SOLN
10.0000 meq | INTRAVENOUS | Status: AC
Start: 2023-10-06 — End: 2023-10-07
  Administered 2023-10-06 – 2023-10-07 (×4): 10 meq via INTRAVENOUS
  Filled 2023-10-06 (×4): qty 100

## 2023-10-06 MED ORDER — ALBUMIN HUMAN 25 % IV SOLN
12.5000 g | Freq: Once | INTRAVENOUS | Status: AC
  Administered 2023-10-06: 12.5 g via INTRAVENOUS
  Filled 2023-10-06: qty 50

## 2023-10-06 MED ORDER — HEPARIN SODIUM (PORCINE) 5000 UNIT/ML IJ SOLN
5000.0000 [IU] | Freq: Three times a day (TID) | INTRAMUSCULAR | Status: DC
  Administered 2023-10-06 – 2023-10-07 (×2): 5000 [IU] via SUBCUTANEOUS
  Filled 2023-10-06 (×2): qty 1

## 2023-10-06 MED ORDER — POLYETHYLENE GLYCOL 3350 17 G PO PACK: 17.0000 g | PACK | Freq: Every day | ORAL | Status: DC | PRN

## 2023-10-06 MED ORDER — LACTATED RINGERS IV BOLUS
500.0000 mL | Freq: Once | INTRAVENOUS | Status: AC
  Administered 2023-10-06: 500 mL via INTRAVENOUS

## 2023-10-06 MED ORDER — LACTATED RINGERS IV SOLN: INTRAVENOUS | Status: DC

## 2023-10-06 MED ORDER — METRONIDAZOLE 500 MG/100ML IV SOLN
500.0000 mg | Freq: Once | INTRAVENOUS | Status: AC
  Administered 2023-10-06: 500 mg via INTRAVENOUS
  Filled 2023-10-06: qty 100

## 2023-10-06 MED ORDER — LACTATED RINGERS IV BOLUS
3000.0000 mL | Freq: Once | INTRAVENOUS | Status: AC
Start: 2023-10-06 — End: 2023-10-06
  Administered 2023-10-06: 3000 mL via INTRAVENOUS

## 2023-10-06 MED ORDER — DEXTROSE IN LACTATED RINGERS 5 % IV SOLN: INTRAVENOUS | Status: AC

## 2023-10-06 MED ORDER — MAGNESIUM SULFATE 4 GM/100ML IV SOLN
4.0000 g | Freq: Once | INTRAVENOUS | Status: AC
  Administered 2023-10-06: 4 g via INTRAVENOUS
  Filled 2023-10-06: qty 100

## 2023-10-06 MED ORDER — LORAZEPAM 2 MG/ML IJ SOLN
1.0000 mg | INTRAMUSCULAR | Status: DC | PRN
  Administered 2023-10-07: 1 mg via INTRAVENOUS
  Administered 2023-10-08: 2 mg via INTRAVENOUS
  Filled 2023-10-06 (×2): qty 1

## 2023-10-06 MED ORDER — THIAMINE HCL 100 MG/ML IJ SOLN
500.0000 mg | Freq: Once | INTRAVENOUS | Status: AC
  Administered 2023-10-06: 500 mg via INTRAVENOUS
  Filled 2023-10-06: qty 5

## 2023-10-06 MED ORDER — ORAL CARE MOUTH RINSE: 15.0000 mL | OROMUCOSAL | Status: DC | PRN

## 2023-10-06 MED ORDER — FOLIC ACID 5 MG/ML IJ SOLN
1.0000 mg | Freq: Every day | INTRAMUSCULAR | Status: DC
  Administered 2023-10-06 – 2023-10-10 (×5): 1 mg via INTRAVENOUS
  Filled 2023-10-06 (×6): qty 0.2

## 2023-10-06 MED ORDER — SODIUM CHLORIDE 0.9 % IV BOLUS
500.0000 mL | Freq: Once | INTRAVENOUS | Status: AC
  Administered 2023-10-06: 500 mL via INTRAVENOUS

## 2023-10-06 MED ORDER — ORAL CARE MOUTH RINSE: 15.0000 mL | OROMUCOSAL | Status: DC

## 2023-10-06 MED ORDER — PREDNISOLONE 5 MG PO TABS
40.0000 mg | ORAL_TABLET | Freq: Every day | ORAL | Status: DC
Start: 2023-10-06 — End: 2023-11-05
  Administered 2023-10-07 – 2023-10-08 (×2): 40 mg via ORAL
  Filled 2023-10-06 (×2): qty 8

## 2023-10-06 MED ORDER — SODIUM CHLORIDE 0.9 % IV SOLN
250.0000 mL | INTRAVENOUS | Status: AC
  Administered 2023-10-06 – 2023-10-07 (×2): 250 mL via INTRAVENOUS

## 2023-10-06 MED ORDER — IOHEXOL 300 MG/ML  SOLN
80.0000 mL | Freq: Once | INTRAMUSCULAR | Status: AC | PRN
  Administered 2023-10-06: 80 mL via INTRAVENOUS

## 2023-10-06 MED ORDER — VITAMIN K1 10 MG/ML IJ SOLN
1.0000 mg | Freq: Every day | INTRAVENOUS | Status: DC
  Administered 2023-10-06: 1 mg via INTRAVENOUS
  Filled 2023-10-06: qty 0.1

## 2023-10-06 MED ORDER — SODIUM CHLORIDE 0.9 % IV SOLN: 2.0000 g | Freq: Two times a day (BID) | INTRAVENOUS | Status: DC

## 2023-10-06 MED ORDER — DEXMEDETOMIDINE HCL IN NACL 200 MCG/50ML IV SOLN: 0.0000 ug/kg/h | INTRAVENOUS | Status: DC

## 2023-10-06 MED ORDER — VANCOMYCIN HCL IN DEXTROSE 1-5 GM/200ML-% IV SOLN
1000.0000 mg | Freq: Once | INTRAVENOUS | Status: AC
  Administered 2023-10-06: 1000 mg via INTRAVENOUS
  Filled 2023-10-06 (×2): qty 200

## 2023-10-06 MED ORDER — NOREPINEPHRINE 4 MG/250ML-% IV SOLN
0.0000 ug/min | INTRAVENOUS | Status: DC
  Administered 2023-10-06: 2 ug/min via INTRAVENOUS
  Filled 2023-10-06: qty 250

## 2023-10-06 MED ORDER — SODIUM CHLORIDE 0.9 % IV SOLN
2.0000 g | Freq: Once | INTRAVENOUS | Status: DC
  Filled 2023-10-06: qty 10

## 2023-10-06 MED ORDER — DOCUSATE SODIUM 100 MG PO CAPS: 100.0000 mg | ORAL_CAPSULE | Freq: Two times a day (BID) | ORAL | Status: DC | PRN

## 2023-10-06 MED ORDER — VITAMIN K1 10 MG/ML IJ SOLN
10.0000 mg | Freq: Every day | INTRAVENOUS | Status: AC
  Administered 2023-10-07 – 2023-10-09 (×3): 10 mg via INTRAVENOUS
  Filled 2023-10-06 (×3): qty 1

## 2023-10-06 MED ORDER — PANTOPRAZOLE SODIUM 40 MG IV SOLR
40.0000 mg | Freq: Every day | INTRAVENOUS | Status: DC
  Administered 2023-10-06 – 2023-10-09 (×4): 40 mg via INTRAVENOUS
  Filled 2023-10-06 (×4): qty 10

## 2023-10-06 MED ORDER — INSULIN ASPART 100 UNIT/ML IJ SOLN
0.0000 [IU] | INTRAMUSCULAR | Status: DC
  Administered 2023-10-06 (×2): 1 [IU] via SUBCUTANEOUS
  Administered 2023-10-07: 2 [IU] via SUBCUTANEOUS
  Administered 2023-10-07: 1 [IU] via SUBCUTANEOUS
  Administered 2023-10-07 (×2): 2 [IU] via SUBCUTANEOUS
  Administered 2023-10-08 – 2023-10-10 (×9): 1 [IU] via SUBCUTANEOUS
  Filled 2023-10-06: qty 0.09

## 2023-10-06 MED ORDER — SODIUM CHLORIDE 0.9 % IV SOLN
2.0000 g | Freq: Three times a day (TID) | INTRAVENOUS | Status: DC
  Administered 2023-10-06: 2 g via INTRAVENOUS
  Filled 2023-10-06: qty 12.5

## 2023-10-06 MED ORDER — THIAMINE HCL 100 MG/ML IJ SOLN
100.0000 mg | Freq: Every day | INTRAMUSCULAR | Status: DC
  Administered 2023-10-07 – 2023-10-10 (×4): 100 mg via INTRAVENOUS
  Filled 2023-10-06 (×4): qty 2

## 2023-10-06 MED ORDER — LACTATED RINGERS IV BOLUS (SEPSIS)
1000.0000 mL | Freq: Once | INTRAVENOUS | Status: AC
  Administered 2023-10-06: 1000 mL via INTRAVENOUS

## 2023-10-06 MED ORDER — DEXMEDETOMIDINE HCL IN NACL 200 MCG/50ML IV SOLN
INTRAVENOUS | Status: AC
  Administered 2023-10-06: 0.4 ug/kg/h via INTRAVENOUS
  Filled 2023-10-06: qty 50

## 2023-10-06 MED ORDER — CHLORHEXIDINE GLUCONATE CLOTH 2 % EX PADS
6.0000 | MEDICATED_PAD | Freq: Every day | CUTANEOUS | Status: DC
  Administered 2023-10-06 – 2023-10-11 (×5): 6 via TOPICAL

## 2023-10-06 MED ORDER — NOREPINEPHRINE 4 MG/250ML-% IV SOLN
2.0000 ug/min | INTRAVENOUS | Status: DC
  Administered 2023-10-06: 10 ug/min via INTRAVENOUS
  Administered 2023-10-07: 6 ug/min via INTRAVENOUS
  Administered 2023-10-07: 10 ug/min via INTRAVENOUS
  Administered 2023-10-08: 9 ug/min via INTRAVENOUS
  Administered 2023-10-08: 8 ug/min via INTRAVENOUS
  Administered 2023-10-08: 6 ug/min via INTRAVENOUS
  Administered 2023-10-09: 11 ug/min via INTRAVENOUS
  Administered 2023-10-09: 8 ug/min via INTRAVENOUS
  Administered 2023-10-10: 15 ug/min via INTRAVENOUS
  Administered 2023-10-10: 14 ug/min via INTRAVENOUS
  Administered 2023-10-10 (×2): 15 ug/min via INTRAVENOUS
  Filled 2023-10-06 (×12): qty 250

## 2023-10-06 MED ORDER — SODIUM PHOSPHATES 45 MMOLE/15ML IV SOLN
15.0000 mmol | Freq: Once | INTRAVENOUS | Status: AC
  Administered 2023-10-06: 15 mmol via INTRAVENOUS
  Filled 2023-10-06: qty 5

## 2023-10-06 NOTE — Progress Notes (Signed)
 PHARMACY NOTE -  ANTIBIOTIC RENAL DOSE ADJUSTMENT   Request received for Pharmacy to assist with antibiotic renal dose adjustment.  Patient has been initiated on Cefepime for sepsis. SCr 1.13, estimated CrCl 44 ml/min Will adjust Cefepime to q12 hrs dosing.  Nachelle Negrette S. Merilynn Finland, PharmD, BCPS Clinical Staff Pharmacist

## 2023-10-06 NOTE — ED Notes (Signed)
 Patient transported to CT

## 2023-10-06 NOTE — ED Notes (Addendum)
 Dr. Rubin Payor made aware of patient's BP of 77/51. Patient is alert and denies symptoms.  Verbal order to give of LR. Will administer the remainder of 1000 ml bag of LR.

## 2023-10-06 NOTE — Progress Notes (Addendum)
 eLink Physician-Brief Progress Note Patient Name: Stephanie Schmidt DOB: [birth year] MRN: [id number]   Date of Service  10/06/2023  HPI/Events of Note  Hg 8, stable, was 8.2 . No GI bleeding, dilutional post 5 lit , drop of Hg from 12 on admit earlier. Follow Hg at mid night 2. Alcoholic hepatitis. Low albumin. 3.  shock . Mostly dehydration.  Less likely sepsis. LA trending down. Wbc settling. Precedex turned off. Latest MAP 62 improving.   Camera evaluation done. On room air. MAP 62.  On levo at 10. Vit K.   eICU Interventions  Albumin and saline 500 ml bolus. Making urine.  Kcl IV replacement ordered. Add mag, po4 level stat.   Discussed with RN.      Intervention Category Intermediate Interventions: Electrolyte abnormality - evaluation and management;Hypertension - evaluation and management  Ranee Gosselin 10/06/2023, 9:08 PM  23:11 Mag 1.3 and phos 2.3 . Hypokalemia of 3.4, on 2/4 Kcl run Mag IV 4 gm stat Sod po4 10 mmols.   04:17 BP soft 85/51 (62). Max on 10 of levo. PIVs only. Earlier fluid bolus did help in improving MAP.  -re albumin and LR bolus. Urine- ok.  - if not better, need central /art line for increasing levophed and for other pressors.   Should we hold her heparin SQ?  PT/INR 23.0/2.0. Gave vitamin K earlier. -hold heparin.

## 2023-10-06 NOTE — ED Notes (Signed)
 Patient taken to CT by staff.

## 2023-10-06 NOTE — Progress Notes (Signed)
 Pharmacy Antibiotic Note  Stephanie Schmidt is a 63 y.o. female admitted on 10/06/2023 with sepsis.  Pharmacy has been consulted for cefepime dosing.  Plan: Cefepime 2g IV q8     Temp (24hrs), Avg:97.4 F (36.3 C), Min:97.2 F (36.2 C), Max:97.5 F (36.4 C)  Recent Labs  Lab 10/06/23 1037 10/06/23 1245 10/06/23 1626  WBC 9.4  --   --   CREATININE  --  1.13*  --   LATICACIDVEN  --   --  >15.0*    CrCl cannot be calculated (Unknown ideal weight.).    Allergies  Allergen Reactions   Clindamycin Swelling    Swelling of tongue   Codeine Nausea And Vomiting   Penicillins Hives   Hydrocodone Nausea And Vomiting     Thank you for allowing pharmacy to be a part of this patient's care.  Berkley Harvey 10/06/2023 5:50 PM

## 2023-10-06 NOTE — ED Provider Notes (Signed)
 [place] EMERGENCY DEPARTMENT AT [facility] Provider Note   CSN: [id number] Arrival date & time: 10/06/23  2725     History  Chief Complaint  Patient presents with   Alcohol Intoxication   Altered Mental Status    Stephanie Schmidt is a 63 y.o. female.   Alcohol Intoxication  Altered Mental Status Patient mental status change.  Has been worsening for around 2 to 3 months now.  Reportedly had a fall and hit her head at that time would not come into the hospital.  More confusion since.  However also has had decreased oral intake.  Now has some jaundiced sclera.  Has been tremulous.  More confusion.  Does appear confused at this time history comes mostly from patient's sister and boyfriend. Reportedly does drink heavily.  History of lupus and is reportedly normally well-controlled. Has also had weight loss.     Home Medications Prior to Admission medications   Medication Sig Start Date End Date Taking? Authorizing Provider  Flaxseed, Linseed, (FLAX SEEDS PO) Take 1 capsule by mouth every other day.    [provider]  Multiple Vitamin (MULTIVITAMIN) tablet Take 1 tablet by mouth every other day.    [provider]  Omega-3 Fatty Acids (FISH OIL) 1000 MG CAPS Take 1 capsule by mouth daily.    [provider]  tetrahydrozoline-zinc (VISINE-AC) 0.05-0.25 % ophthalmic solution Place 2 drops into both eyes as needed (itching).     [provider]  vitamin B-12 (CYANOCOBALAMIN) 50 MCG tablet Take 50 mcg by mouth daily.    [provider]      Allergies    Clindamycin, Codeine, Penicillins, and Hydrocodone    Review of Systems   Review of Systems  Physical Exam Updated Vital Signs BP (!) 87/56   Pulse (!) 115   Temp (!) 97.2 F (36.2 C) (Rectal) Comment: Dr. Rubin Payor made aware.  Resp 16   SpO2 100%  Physical Exam Vitals reviewed.  Constitutional:      Comments: Somewhat cachectic.  HENT:     Head:  Normocephalic.     Mouth/Throat:     Mouth: Mucous membranes are dry.  Eyes:     General: Scleral icterus present.  Cardiovascular:     Rate and Rhythm: Tachycardia present.  Abdominal:     Tenderness: There is abdominal tenderness.     Comments: Some right upper quadrant tenderness.  Skin:    General: Skin is warm.  Neurological:     Comments: Awake with some confusion.  Tremulousness.   Pitting edema bilateral lower extremities.  ED Results / Procedures / Treatments   Labs (all labs ordered are listed, but only abnormal results are displayed) Labs Reviewed  PROTIME-INR - Abnormal; Notable for the following components:      Result Value   Prothrombin Time 22.5 (*)    INR 2.0 (*)    All other components within normal limits  CBC WITH DIFFERENTIAL/PLATELET - Abnormal; Notable for the following components:   RBC 3.13 (*)    HCT 35.8 (*)    MCV 114.4 (*)    MCH 41.2 (*)    RDW 19.6 (*)    Platelets 140 (*)    nRBC 3.0 (*)    All other components within normal limits  COMPREHENSIVE METABOLIC PANEL - Abnormal; Notable for the following components:   Sodium 125 (*)    Chloride 88 (*)    Creatinine, Ser 1.13 (*)    Calcium 8.5 (*)  Albumin 1.9 (*)    AST 140 (*)    ALT 48 (*)    Alkaline Phosphatase 145 (*)    Total Bilirubin 8.6 (*)    GFR, Estimated 55 (*)    All other components within normal limits  ACETAMINOPHEN LEVEL - Abnormal; Notable for the following components:   Acetaminophen (Tylenol), Serum <10 (*)    All other components within normal limits  CULTURE, BLOOD (ROUTINE X 2)  CULTURE, BLOOD (ROUTINE X 2)  RESP PANEL BY RT-PCR (RSV, FLU A&B, COVID)  RVPGX2  AMMONIA  ETHANOL  RAPID URINE DRUG SCREEN, HOSP PERFORMED  URINALYSIS, W/ REFLEX TO CULTURE (INFECTION SUSPECTED)  TSH  I-STAT CG4 LACTIC ACID, ED    EKG None  Radiology CT Head Wo Contrast Result Date: 10/06/2023 CLINICAL DATA:  Altered mentation, scleral jaundice, recent head trauma. EXAM: CT  HEAD WITHOUT CONTRAST TECHNIQUE: Contiguous axial images were obtained from the base of the skull through the vertex without intravenous contrast. RADIATION DOSE REDUCTION: This exam was performed according to the departmental dose-optimization program which includes automated exposure control, adjustment of the mA and/or kV according to patient size and/or use of iterative reconstruction technique. COMPARISON:  None Available. FINDINGS: Brain: No evidence of acute infarction, hemorrhage, hydrocephalus, extra-axial collection or mass lesion/mass effect. There is mild cerebral volume loss with associated ex vacuo dilatation. Vascular: There are vascular calcifications in the carotid siphons. Skull: Normal. Negative for fracture or focal lesion. Sinuses/Orbits: No acute finding. Other: None. IMPRESSION: No acute intracranial process. Electronically Signed   By: Romona Curls M.D.   On: 10/06/2023 11:38   DG Chest Portable 1 View Result Date: 10/06/2023 CLINICAL DATA:  Altered mental status.  Jaundice. EXAM: PORTABLE CHEST 1 VIEW COMPARISON:  Chest radiographs 07/28/2021 and 05/20/2020. Neck CT 05/19/2019. FINDINGS: 1059 hours. The heart size and mediastinal contours are normal. Indeterminate nodular density projecting over the left costophrenic angle was not seen previously and could reflect a nipple shadow, healing rib fracture or pulmonary nodule. The lungs are otherwise clear. There is no pleural effusion or pneumothorax. Old fractures of the right clavicle and several ribs are grossly unchanged. No acute osseous findings are seen. Telemetry leads overlie the chest. IMPRESSION: 1. No evidence of acute cardiopulmonary process. 2. Indeterminate new nodular density projecting over the left lung base with further discussion as above. Consider further evaluation with left rib series. Electronically Signed   By: Carey Bullocks M.D.   On: 10/06/2023 11:31    Procedures Procedures    Medications Ordered in  ED Medications  lactated ringers bolus 500 mL (has no administration in time range)  lactated ringers infusion (has no administration in time range)  lactated ringers bolus 1,000 mL (has no administration in time range)  aztreonam (AZACTAM) 2 g in sodium chloride 0.9 % 100 mL IVPB (has no administration in time range)  metroNIDAZOLE (FLAGYL) IVPB 500 mg (has no administration in time range)  vancomycin (VANCOCIN) IVPB 1000 mg/200 mL premix (has no administration in time range)  thiamine (VITAMIN B1) 500 mg in sodium chloride 0.9 % 50 mL IVPB (has no administration in time range)  lactated ringers bolus 500 mL (0 mLs Intravenous Stopped 10/06/23 1419)  iohexol (OMNIPAQUE) 300 MG/ML solution 80 mL (80 mLs Intravenous Contrast Given 10/06/23 1506)    ED Course/ Medical Decision Making/ A&P  Medical Decision Making Amount and/or Complexity of Data Reviewed Labs: ordered. Radiology: ordered.  Risk Prescription drug management.   Patient with mental status change and jaundice.  Differential diagnoses long but does include some traumatic causes but also hepatic failure.  Will get basic blood work.  Will get chest x-ray and head CT.  Chest x-ray showed potentially nipple versus mass or rib issue.  Will need more imaging but potential need CT scan of the chest or abdomen pelvis.  INR is elevated.  Has had clotting of her tubes.  Still waiting on blood work.  Patient now more hypotensive, however mental status has improved.  Found to be hypothermic on her rectal temperature.  Still normal white count but with the hypotension will now give more fluid bolus including antibiotics in case there is no sepsis.  Urinalysis added.  Will get CT chest abdomen pelvis.  With mental status change and reportedly heavy alcohol use will also give dose of high-dose thiamine.  CRITICAL CARE Performed by: Benjiman Core Total critical care time: 45 minutes Critical care time  was exclusive of separately billable procedures and treating other patients. Critical care was necessary to treat or prevent imminent or life-threatening deterioration. Critical care was time spent personally by me on the following activities: development of treatment plan with patient and/or surrogate as well as nursing, discussions with consultants, evaluation of patient's response to treatment, examination of patient, obtaining history from patient or surrogate, ordering and performing treatments and interventions, ordering and review of laboratory studies, ordering and review of radiographic studies, pulse oximetry and re-evaluation of patient's condition.            Final Clinical Impression(s) / ED Diagnoses Final diagnoses:  Encephalopathy, unspecified type    Rx / DC Orders ED Discharge Orders     None         Benjiman Core, MD 10/06/23 1510

## 2023-10-06 NOTE — ED Notes (Addendum)
 Dr. Rubin Payor made aware that patient's mental status is back to baseline per family. Also informed him of patient's BP of 87/56. Dr. Rubin Payor went to bedside to evaluate patient.

## 2023-10-06 NOTE — Progress Notes (Addendum)
   Call from RN She is withdrawing  Plan  Precedex gtt If pressor needs get worse then RN to call eMD   SIGNATURE    Dr. Kalman Shan, M.D., F.C.C.P,  Pulmonary and Critical Care Medicine Staff Physician, [facility] Director - Interstitial Lung Disease  Program  Pulmonary Fibrosis [facility] at [facility] [place], [place], [address]   Pager: [phone], If no answer  -> Check AMION or Try [phone] Telephone (clinical office): [phone] Telephone (research): [phone]  6:45 PM 10/06/2023
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
## 2023-10-06 NOTE — ED Triage Notes (Signed)
 Pt BIB EMS from home r/t increased altered mentation with jaundice sclera. Increased agitation with tremors. Per family, family fell over two months ago and has declined in function since then.   BP 122/70 SpO2 96% CBG 136

## 2023-10-06 NOTE — ED Provider Notes (Signed)
  Physical Exam  BP (!) 83/48 (BP Location: Left Arm)   Pulse (!) 114   Temp (!) 97.2 F (36.2 C) (Rectal) Comment: Dr. Rubin Payor made aware.  Resp 18   SpO2 100%   Physical Exam  Procedures  Procedures  ED Course / MDM    Medical Decision Making Care assumed at 3 PM.  Patient is here with confusion.  Patient drinks alcohol daily.  Patient was also noted to have scleral icterus.  Patient also hypothermic and tachycardic and hypotensive.  Sepsis workup initiated.  30 cc/kg bolus ordered.  Broad-spectrum antibiotics were ordered.  Signout pending CT chest abdomen pelvis and reassessment  4:28 PM I reviewed patient's labs and independently interpreted imaging studies.  Bilirubin is 8.  Tylenol and alcohol levels are negative.  CT chest abdomen pelvis showed steatosis and possible hepatitis.  Patient has small volume ascites.  Patient remains hypotensive with blood pressure in the 70s to 80s.  Patient is started on Levophed.  I discussed case with Dr. Lavon Paganini from GI.  She felt that this is acute liver failure from alcohol.  She agreed with pressors and broad-spectrum antibiotics.  She states that if her cultures are negative, she anticipated that she may need to be started on high-dose steroids tomorrow.  At this point, ICU to admit.  CRITICAL CARE Performed by: Richardean Canal   Total critical care time: 38 minutes  Critical care time was exclusive of separately billable procedures and treating other patients.  Critical care was necessary to treat or prevent imminent or life-threatening deterioration.  Critical care was time spent personally by me on the following activities: development of treatment plan with patient and/or surrogate as well as nursing, discussions with consultants, evaluation of patient's response to treatment, examination of patient, obtaining history from patient or surrogate, ordering and performing treatments and interventions, ordering and review of laboratory  studies, ordering and review of radiographic studies, pulse oximetry and re-evaluation of patient's condition.   Problems Addressed: Acute liver failure without hepatic coma: acute illness or injury Encephalopathy, unspecified type: acute illness or injury Hypotension, unspecified hypotension type: acute illness or injury  Amount and/or Complexity of Data Reviewed Labs: ordered. Decision-making details documented in ED Course. Radiology: ordered and independent interpretation performed. Decision-making details documented in ED Course.  Risk Prescription drug management. Decision regarding hospitalization.          Charlynne Pander, MD 10/06/23 [phone]

## 2023-10-06 NOTE — Sepsis Progress Note (Addendum)
 Elink following code sepsis  1612 message sent to bedside RN to see if [facility] were drawn prior to the start of flagyl- RN confirmed [facility] were not drawn prior to start of flagyl  1828 notified ICU bedside RN that second lactic is need, have asked if they can call for to be drawn since no central line access.

## 2023-10-07 ENCOUNTER — Inpatient Hospital Stay (HOSPITAL_COMMUNITY)

## 2023-10-07 ENCOUNTER — Inpatient Hospital Stay (HOSPITAL_COMMUNITY)
Admit: 2023-10-07 | Discharge: 2023-10-07 | Disposition: A | Discharge status: Home / Self Care | Attending: Internal Medicine | Admitting: Internal Medicine

## 2023-10-07 DIAGNOSIS — K7010 Alcoholic hepatitis without ascites: Secondary | ICD-10-CM | POA: Diagnosis not present

## 2023-10-07 DIAGNOSIS — I428 Other cardiomyopathies: Secondary | ICD-10-CM | POA: Diagnosis not present

## 2023-10-07 DIAGNOSIS — G9341 Metabolic encephalopathy: Secondary | ICD-10-CM | POA: Diagnosis not present

## 2023-10-07 DIAGNOSIS — R4182 Altered mental status, unspecified: Secondary | ICD-10-CM | POA: Diagnosis not present

## 2023-10-07 DIAGNOSIS — R579 Shock, unspecified: Secondary | ICD-10-CM | POA: Diagnosis not present

## 2023-10-07 DIAGNOSIS — R569 Unspecified convulsions: Secondary | ICD-10-CM

## 2023-10-07 DIAGNOSIS — K7290 Hepatic failure, unspecified without coma: Secondary | ICD-10-CM | POA: Diagnosis not present

## 2023-10-07 DIAGNOSIS — D689 Coagulation defect, unspecified: Secondary | ICD-10-CM

## 2023-10-07 DIAGNOSIS — D649 Anemia, unspecified: Secondary | ICD-10-CM | POA: Diagnosis not present

## 2023-10-07 DIAGNOSIS — R7989 Other specified abnormal findings of blood chemistry: Secondary | ICD-10-CM

## 2023-10-07 DIAGNOSIS — E871 Hypo-osmolality and hyponatremia: Secondary | ICD-10-CM

## 2023-10-07 DIAGNOSIS — D696 Thrombocytopenia, unspecified: Secondary | ICD-10-CM

## 2023-10-07 DIAGNOSIS — N179 Acute kidney failure, unspecified: Secondary | ICD-10-CM | POA: Diagnosis not present

## 2023-10-07 LAB — COMPREHENSIVE METABOLIC PANEL
ALT: 45 U/L — ABNORMAL HIGH (ref 0–44)
AST: 125 U/L — ABNORMAL HIGH (ref 15–41)
Albumin: 2 g/dL — ABNORMAL LOW (ref 3.5–5.0)
Alkaline Phosphatase: 146 U/L — ABNORMAL HIGH (ref 38–126)
Anion gap: 12 (ref 5–15)
BUN: 9 mg/dL (ref 8–23)
CO2: 24 mmol/L (ref 22–32)
Calcium: 8.5 mg/dL — ABNORMAL LOW (ref 8.9–10.3)
Chloride: 93 mmol/L — ABNORMAL LOW (ref 98–111)
Creatinine, Ser: 0.66 mg/dL (ref 0.44–1.00)
GFR, Estimated: >60 mL/min (ref >60)
Glucose, Bld: 145 mg/dL — ABNORMAL HIGH (ref 70–99)
Potassium: 3.6 mmol/L (ref 3.5–5.1)
Sodium: 129 mmol/L — ABNORMAL LOW (ref 135–145)
Total Bilirubin: 9.4 mg/dL — ABNORMAL HIGH (ref 0.0–1.2)
Total Protein: 7.3 g/dL (ref 6.5–8.1)

## 2023-10-07 LAB — GLUCOSE, CAPILLARY
Glucose-Capillary: 119 mg/dL — ABNORMAL HIGH (ref 70–99)
Glucose-Capillary: 122 mg/dL — ABNORMAL HIGH (ref 70–99)
Glucose-Capillary: 140 mg/dL — ABNORMAL HIGH (ref 70–99)
Glucose-Capillary: 172 mg/dL — ABNORMAL HIGH (ref 70–99)
Glucose-Capillary: 183 mg/dL — ABNORMAL HIGH (ref 70–99)
Glucose-Capillary: 199 mg/dL — ABNORMAL HIGH (ref 70–99)

## 2023-10-07 LAB — CBC WITH DIFFERENTIAL/PLATELET
Abs Immature Granulocytes: 0.09 10*3/uL — ABNORMAL HIGH (ref 0.00–0.07)
Basophils Absolute: 0 10*3/uL (ref 0.0–0.1)
Basophils Relative: 0 %
Eosinophils Absolute: 0.1 10*3/uL (ref 0.0–0.5)
Eosinophils Relative: 1 %
HCT: 23.1 % — ABNORMAL LOW (ref 36.0–46.0)
Hemoglobin: 8.3 g/dL — ABNORMAL LOW (ref 12.0–15.0)
Immature Granulocytes: 1 %
Lymphocytes Relative: 12 %
Lymphs Abs: 1.6 10*3/uL (ref 0.7–4.0)
MCH: 42.1 pg — ABNORMAL HIGH (ref 26.0–34.0)
MCHC: 35.9 g/dL (ref 30.0–36.0)
MCV: 117.3 fL — ABNORMAL HIGH (ref 80.0–100.0)
Monocytes Absolute: 1 10*3/uL (ref 0.1–1.0)
Monocytes Relative: 8 %
Neutro Abs: 10.5 10*3/uL — ABNORMAL HIGH (ref 1.7–7.7)
Neutrophils Relative %: 78 %
Platelets: 143 10*3/uL — ABNORMAL LOW (ref 150–400)
RBC: 1.97 MIL/uL — ABNORMAL LOW (ref 3.87–5.11)
RDW: 20.1 % — ABNORMAL HIGH (ref 11.5–15.5)
WBC: 13.2 10*3/uL — ABNORMAL HIGH (ref 4.0–10.5)
nRBC: 2.1 % — ABNORMAL HIGH (ref 0.0–0.2)

## 2023-10-07 LAB — URINALYSIS, W/ REFLEX TO CULTURE (INFECTION SUSPECTED)
Bilirubin Urine: NEGATIVE
Glucose, UA: NEGATIVE mg/dL
Hgb urine dipstick: NEGATIVE
Ketones, ur: NEGATIVE mg/dL
Nitrite: NEGATIVE
Protein, ur: NEGATIVE mg/dL
Specific Gravity, Urine: 1.006 (ref 1.005–1.030)
pH: 5 (ref 5.0–8.0)

## 2023-10-07 LAB — HIV ANTIBODY (ROUTINE TESTING W REFLEX): HIV Screen 4th Generation wRfx: NONREACTIVE

## 2023-10-07 LAB — PROCALCITONIN: Procalcitonin: 0.29 ng/mL

## 2023-10-07 LAB — T4, FREE: Free T4: 0.75 ng/dL (ref 0.61–1.12)

## 2023-10-07 LAB — ECHOCARDIOGRAM COMPLETE
AR max vel: 2.55 cm2
AV Area VTI: 2.48 cm2
AV Area mean vel: 2.17 cm2
AV Mean grad: 6 mmHg
AV Peak grad: 10.1 mmHg
Ao pk vel: 1.59 m/s
Area-P 1/2: 4.24 cm2
Height: 66 in
S' Lateral: 2 cm
Weight: 1954.16 [oz_av]

## 2023-10-07 LAB — GAMMA GT: GGT: 144 U/L — ABNORMAL HIGH (ref 7–50)

## 2023-10-07 LAB — PHOSPHORUS: Phosphorus: 2.8 mg/dL (ref 2.5–4.6)

## 2023-10-07 LAB — PROTIME-INR
INR: 2 — ABNORMAL HIGH (ref 0.8–1.2)
Prothrombin Time: 23 s — ABNORMAL HIGH (ref 11.4–15.2)

## 2023-10-07 LAB — RAPID URINE DRUG SCREEN, HOSP PERFORMED
Amphetamines: NOT DETECTED
Barbiturates: NOT DETECTED
Benzodiazepines: NOT DETECTED
Cocaine: NOT DETECTED
Opiates: NOT DETECTED
Tetrahydrocannabinol: NOT DETECTED

## 2023-10-07 LAB — VITAMIN B12: Vitamin B-12: 2878 pg/mL — ABNORMAL HIGH (ref 180–914)

## 2023-10-07 LAB — TROPONIN I (HIGH SENSITIVITY): Troponin I (High Sensitivity): 19 ng/L — ABNORMAL HIGH (ref <18)

## 2023-10-07 LAB — ACETAMINOPHEN LEVEL: Acetaminophen (Tylenol), Serum: <10 ug/mL — ABNORMAL LOW (ref 10–30)

## 2023-10-07 LAB — FERRITIN: Ferritin: 2028 ng/mL — ABNORMAL HIGH (ref 11–307)

## 2023-10-07 LAB — HEPATITIS B SURFACE ANTIBODY,QUALITATIVE: Hep B S Ab: NONREACTIVE

## 2023-10-07 LAB — IRON AND TIBC: Iron: 87 ug/dL (ref 28–170)

## 2023-10-07 LAB — MAGNESIUM: Magnesium: 2.7 mg/dL — ABNORMAL HIGH (ref 1.7–2.4)

## 2023-10-07 LAB — FOLATE: Folate: 6.5 ng/mL (ref >5.9)

## 2023-10-07 LAB — HEPATITIS A ANTIBODY, TOTAL: hep A Total Ab: NONREACTIVE

## 2023-10-07 MED ORDER — ALBUMIN HUMAN 25 % IV SOLN
25.0000 g | Freq: Once | INTRAVENOUS | Status: AC
  Administered 2023-10-07: 25 g via INTRAVENOUS
  Filled 2023-10-07: qty 100

## 2023-10-07 MED ORDER — MIDODRINE HCL 5 MG PO TABS
10.0000 mg | ORAL_TABLET | Freq: Three times a day (TID) | ORAL | Status: DC
  Administered 2023-10-07 – 2023-10-08 (×3): 10 mg via ORAL
  Filled 2023-10-07 (×4): qty 2

## 2023-10-07 MED ORDER — LACTULOSE 10 GM/15ML PO SOLN
30.0000 g | Freq: Three times a day (TID) | ORAL | Status: DC
  Administered 2023-10-08 – 2023-10-10 (×6): 30 g
  Filled 2023-10-07 (×6): qty 45

## 2023-10-07 MED ORDER — METRONIDAZOLE 500 MG/100ML IV SOLN
500.0000 mg | Freq: Two times a day (BID) | INTRAVENOUS | Status: DC
  Administered 2023-10-07 – 2023-10-09 (×5): 500 mg via INTRAVENOUS
  Filled 2023-10-07 (×5): qty 100

## 2023-10-07 MED ORDER — SODIUM CHLORIDE 0.9 % IV SOLN
2.0000 g | INTRAVENOUS | Status: DC
  Administered 2023-10-07 – 2023-10-08 (×2): 2 g via INTRAVENOUS
  Filled 2023-10-07 (×3): qty 20

## 2023-10-07 MED ORDER — VITAL HIGH PROTEIN PO LIQD: 1000.0000 mL | ORAL | Status: DC

## 2023-10-07 MED ORDER — LACTULOSE 10 GM/15ML PO SOLN
20.0000 g | Freq: Two times a day (BID) | ORAL | Status: DC
  Administered 2023-10-07: 20 g
  Filled 2023-10-07: qty 30

## 2023-10-07 MED ORDER — ENSURE ENLIVE PO LIQD
237.0000 mL | Freq: Two times a day (BID) | ORAL | Status: DC
  Administered 2023-10-07: 237 mL via ORAL

## 2023-10-07 MED ORDER — ALBUMIN HUMAN 25 % IV SOLN
25.0000 g | Freq: Four times a day (QID) | INTRAVENOUS | Status: AC
  Administered 2023-10-07 – 2023-10-08 (×4): 25 g via INTRAVENOUS
  Filled 2023-10-07 (×4): qty 100

## 2023-10-07 MED ORDER — LACTATED RINGERS IV BOLUS
500.0000 mL | Freq: Once | INTRAVENOUS | Status: AC
  Administered 2023-10-07: 500 mL via INTRAVENOUS

## 2023-10-07 NOTE — Progress Notes (Signed)
 NAME:  Stephanie Schmidt, MRN:  [id number], DOB:  March 12, 1961, LOS: 1 ADMISSION DATE:  10/06/2023, CONSULTATION DATE:  10/06/23 REFERRING MD:  Dr. Silverio Lay CHIEF COMPLAINT:  Acute Liver Failure   History of Present Illness:  63 year old cachectic female accompanied by her boyfriend and sister.  She has not seen a primary care physician in at least 3 years.  Last seen by [facility] in 2018 for epigastric pain with differential diagnosis of gastritis versus PUD.  Appears the last few years patient's been a heavy alcoholic with his also history of lupus not otherwise specified.  Significant weight loss and cachexia prior to admission.  She is status post splenectomy for unclear reasons   Presented to the ED at [facility] on 10/06/2023 with worsening confusion over a few months.  Few months ago had a fall and hit her head [CT head negative] also history of decreased oral intake and weight loss and jaundice.  In the ED looked extremely frail and cachectic and dehydrated.  2 L fluid given and requiring 4 mcg of Levophed.  Labs show greater than 15 lactic acid, bilirubin greater than 8, AST 148/ALT 48 and highly concerning for acute alcoholic hepatitis.  With an INR of 2 flu and COVID PCR negative.  CT abdomen showed fatty liver along with very small ascites.  Tylenol level normal   Chest x-ray with some concern of left upper lobe nodule but otherwise clear/ - CT chest with some emphyaema   CCM asked to admit.  Pertinent  Medical History  has a past medical history of Benign hematuria, Collapse, lung, Collar bone fracture, Concussion, Dislocated knee, Hemorrhage in the brain [facility]), Internal hemorrhoids, Lupus, Pulmonary sarcoidosis (HCC), Right hand fracture, and Tubular adenoma of colon.    reports that she quit smoking about 36 years ago. Her smoking use included cigarettes. She has never used smokeless tobacco.  Significant Hospital Events: Including procedures, antibiotic start and stop dates in addition to  other pertinent events   3/2 admitted for shock. Acute alcoholic hepatitis and delirium  Interim History / Subjective:  Started on precedex overnight for concern of alcohol withdrawal Remains on of levo No acute distress, very confused  Objective   Blood pressure (!) 95/57, pulse (!) 106, temperature 97.7 F (36.5 C), temperature source Oral, resp. rate 20, height 5\' 6"  (1.676 m), weight 55.4 kg, SpO2 91%.        Intake/Output Summary (Last 24 hours) at 10/07/2023 0819 Last data filed at 10/07/2023 0556 Gross per 24 hour  Intake 8507.31 ml  Output 1375 ml  Net 7132.31 ml   Filed Weights   10/06/23 1745 10/07/23 0412  Weight: 55.4 kg 55.4 kg    Examination: General: chronically ill woman, no acute distress HENT: [place]/AT, green discharge bilateral eyes Lungs: clear to auscultation, no wheezing Cardiovascular: rrr, no murmurs Abdomen: soft, non-tender, BS+ Extremities: warm, 2-3+ edema Neuro: awake, not alert to person/place/time, moving all extremities GU: n/a  Resolved Hospital Problem list     Assessment & Plan:  Shock - hypovolemic vs septic vs adrenal insufficiency - Echo is normal - continue broad antibiotic coverage with cefepime and flagyl, stop vanc - Follow up cultures - MAP goal 65 or greater - continue peripheral levophed - start midodrine 10mg  TID - albumin 25g x 3 doses q6hrs - prednisolone for alcohol hepatitis will help with adrenal insufficiency  Acute Metabolic Encephalopathy - CT head unremarkable - ammonia slightly elevated, start lactulose 20mg  BID per tube - avoid  sedating meds  AKI Lactic Acidosis Hyopkalemia Hyponatremia - trend lactic acid - Na slowly improving with volume resuscitation - Cr improved - albumin 25g q6hrs as above  Alcoholic Hepatitis Cirrhosis Ascites - GI following - prednisolone 40mg  daily started 3/2  - lactulose 20g BID starting today - RUQ [place] pending, if enough ascites will plan for  paracentesis  Anemia Thrombocytopenia Coagulopathy - trend H/H and platelets - transfuse for hemoglobin 7g/dL or less - vitamin K supplementation  Severe protein calorie malnutrition - ordered cortrak - start trickle tube feeds, monitor closely for re-feeding syndrome - nutrition consult placed    Best Practice (right click and "Reselect all SmartList Selections" daily)   Diet/type: tubefeeds DVT prophylaxis prophylactic heparin  Pressure ulcer(s): N/A GI prophylaxis: PPI Lines: N/A Foley:  Yes, and it is still needed Code Status:  full code Last date of multidisciplinary goals of care discussion [n/a]  Labs   CBC: Recent Labs  Lab 10/06/23 1037 10/06/23 1804 10/06/23 2020 10/07/23 0249  WBC 9.4  --  14.2* 13.2*  NEUTROABS 7.6  --   --  10.5*  HGB 12.9 8.2* 8.0* 8.3*  HCT 35.8* 24.0* 21.8* 23.1*  MCV 114.4*  --  114.1* 117.3*  PLT 140*  --  141* 143*    Basic Metabolic Panel: Recent Labs  Lab 10/06/23 1245 10/06/23 1804 10/06/23 1851 10/06/23 2131 10/07/23 0249  NA 125* 128* 126*  --  129*  K 3.8 3.5 3.4*  --  3.6  CL 88*  --  89*  --  93*  CO2 23  --  26  --  24  GLUCOSE 91  --  112*  --  145*  BUN 13  --  10  --  9  CREATININE 1.13*  --  0.88  --  0.66  CALCIUM 8.5*  --  8.4*  --  8.5*  MG  --   --   --  1.3* 2.7*  PHOS  --   --   --  2.3* 2.8   GFR: Estimated Creatinine Clearance: 63 mL/min (by C-G formula based on SCr of 0.66 mg/dL). Recent Labs  Lab 10/06/23 1037 10/06/23 1626 10/06/23 1851 10/06/23 2020 10/07/23 0249  PROCALCITON  --   --  0.30  --  0.29  WBC 9.4  --   --  14.2* 13.2*  LATICACIDVEN  --  >15.0* 3.3*  --   --     Liver Function Tests: Recent Labs  Lab 10/06/23 1245 10/06/23 1851 10/07/23 0249  AST 140* 129* 125*  ALT 48* 46* 45*  ALKPHOS 145* 135* 146*  BILITOT 8.6* 8.4* 9.4*  PROT 7.4 6.9 7.3  ALBUMIN 1.9* 1.7* 2.0*   Recent Labs  Lab 10/06/23 1851  LIPASE 31  AMYLASE 40   Recent Labs  Lab  10/06/23 1318 10/06/23 1851  AMMONIA 19 53*    ABG    Component Value Date/Time   PHART 7.606 (HH) 10/06/2023 1804   PCO2ART 27.8 (L) 10/06/2023 1804   PO2ART 29 (LL) 10/06/2023 1804   HCO3 27.8 10/06/2023 1804   TCO2 29 10/06/2023 1804   O2SAT 72 10/06/2023 1804     Coagulation Profile: Recent Labs  Lab 10/06/23 1037 10/06/23 1851 10/07/23 0249  INR 2.0* 2.0* 2.0*    Cardiac Enzymes: No results for input(s): "CKTOTAL", "CKMB", "CKMBINDEX", "TROPONINI" in the last 168 hours.  HbA1C: Hgb A1c MFr Bld  Date/Time Value Ref Range Status  07/28/2021 10:10 AM 5.1 4.6 - 6.5 %  Final    Comment:    Glycemic Control Guidelines for People with Diabetes:Non Diabetic:  <6%Goal of Therapy: <7%Additional Action Suggested:  >8%     CBG: Recent Labs  Lab 10/06/23 1844 10/06/23 2000 10/06/23 2345 10/07/23 0325 10/07/23 0745  GLUCAP 111* 136* 128* 172* 183*      Critical care time: 45 minutes    The patient Jada Jamison is critically ill with multiple organ systems failure and requires high complexity decision making for assessment and support, frequent evaluation and titration of therapies, application of advanced monitoring technologies and extensive interpretation of multiple databases.   Melody Comas, MD [facility] Office: [phone]   See Amion for personal pager PCCM on call pager [phone] until 7pm. Please call Elink 7p-7a. [phone]

## 2023-10-07 NOTE — Progress Notes (Signed)
 eLink Physician-Brief Progress Note Patient Name: Stephanie Schmidt DOB: [birth year] MRN: [id number]   Date of Service  10/07/2023  HPI/Events of Note  Pt with coagulopathy, given Vitamin K earlier.  Latest INR 2  eICU Interventions  Will hold heparin [place] for now.         Kae Lauman M DELA CRUZ 10/07/2023, 11:17 PM  4:47 AM Notified of Hgb 6.8 Hgb continues to downtrend, no evidence of acute bleed per bedside RN.  INR down to 2.3 with vit K Still hypotensive, on 68mcg/min levophed.  Will plan to transfuse 1 unit pRBC.

## 2023-10-07 NOTE — Consult Note (Signed)
 Please see attestation from 3/3 progress note by PA [facility] for full details of actual consultation.  The note was placed as a progress note in the chart but actually is a consultation note as per attestation. This note is for documentation purposes of consultation.  Corliss Parish, MD [facility] Office # [phone]

## 2023-10-07 NOTE — Progress Notes (Signed)
 EEG complete - results pending

## 2023-10-07 NOTE — Procedures (Signed)
 Patient Name: Stephanie Schmidt  MRN: [id number]  Epilepsy Attending: Charlsie Quest  Referring Physician/Provider: Kalman Shan, MD  Date: 10/07/2023 Duration: 24.16 mins  Patient history: 63yo F with ams. EEG to evaluate for seizure  Level of alertness: Awake, asleep  AEDs during EEG study: Ativan  Technical aspects: This EEG study was done with scalp electrodes positioned according to the 10-20 International system of electrode placement. Electrical activity was reviewed with band pass filter of 1-70Hz , sensitivity of 7 uV/mm, display speed of 61mm/sec with a 60Hz  notched filter applied as appropriate. EEG data were recorded continuously and digitally stored.  Video monitoring was available and reviewed as appropriate.  Description: The posterior dominant rhythm consists of 8-9 Hz activity of moderate voltage (25-35 uV) seen predominantly in posterior head regions, symmetric and reactive to eye opening and eye closing. Sleep was characterized by vertex waves, sleep spindles (12 to 14 Hz), maximal frontocentral region. EEG showed intermittent generalized 3 to 6 Hz theta-delta slowing. Hyperventilation and photic stimulation were not performed.     ABNORMALITY - Intermittent slow, generalized  IMPRESSION: This study is suggestive of mild diffuse encephalopathy. No seizures or epileptiform discharges were seen throughout the recording.  Herminio Kniskern Annabelle Harman
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
## 2023-10-07 NOTE — Plan of Care (Signed)
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
## 2023-10-08 ENCOUNTER — Inpatient Hospital Stay (HOSPITAL_COMMUNITY)

## 2023-10-08 DIAGNOSIS — Z515 Encounter for palliative care: Secondary | ICD-10-CM | POA: Diagnosis not present

## 2023-10-08 DIAGNOSIS — K7201 Acute and subacute hepatic failure with coma: Secondary | ICD-10-CM | POA: Insufficient documentation

## 2023-10-08 DIAGNOSIS — Z66 Do not resuscitate: Secondary | ICD-10-CM | POA: Diagnosis not present

## 2023-10-08 DIAGNOSIS — D649 Anemia, unspecified: Secondary | ICD-10-CM | POA: Diagnosis not present

## 2023-10-08 DIAGNOSIS — Z7189 Other specified counseling: Secondary | ICD-10-CM

## 2023-10-08 DIAGNOSIS — N179 Acute kidney failure, unspecified: Secondary | ICD-10-CM | POA: Diagnosis not present

## 2023-10-08 DIAGNOSIS — K7290 Hepatic failure, unspecified without coma: Secondary | ICD-10-CM | POA: Diagnosis not present

## 2023-10-08 DIAGNOSIS — E43 Unspecified severe protein-calorie malnutrition: Secondary | ICD-10-CM | POA: Insufficient documentation

## 2023-10-08 DIAGNOSIS — G9341 Metabolic encephalopathy: Secondary | ICD-10-CM | POA: Diagnosis not present

## 2023-10-08 DIAGNOSIS — I959 Hypotension, unspecified: Secondary | ICD-10-CM

## 2023-10-08 DIAGNOSIS — R7989 Other specified abnormal findings of blood chemistry: Secondary | ICD-10-CM

## 2023-10-08 DIAGNOSIS — R579 Shock, unspecified: Secondary | ICD-10-CM | POA: Diagnosis not present

## 2023-10-08 DIAGNOSIS — D696 Thrombocytopenia, unspecified: Secondary | ICD-10-CM | POA: Diagnosis not present

## 2023-10-08 DIAGNOSIS — K7200 Acute and subacute hepatic failure without coma: Principal | ICD-10-CM

## 2023-10-08 DIAGNOSIS — G9340 Encephalopathy, unspecified: Secondary | ICD-10-CM

## 2023-10-08 DIAGNOSIS — K7010 Alcoholic hepatitis without ascites: Secondary | ICD-10-CM | POA: Diagnosis not present

## 2023-10-08 LAB — COMPREHENSIVE METABOLIC PANEL
ALT: 33 U/L (ref 0–44)
AST: 89 U/L — ABNORMAL HIGH (ref 15–41)
Albumin: 3.3 g/dL — ABNORMAL LOW (ref 3.5–5.0)
Alkaline Phosphatase: 93 U/L (ref 38–126)
Anion gap: 11 (ref 5–15)
BUN: <5 mg/dL — ABNORMAL LOW (ref 8–23)
CO2: 25 mmol/L (ref 22–32)
Calcium: 8.7 mg/dL — ABNORMAL LOW (ref 8.9–10.3)
Chloride: 94 mmol/L — ABNORMAL LOW (ref 98–111)
Creatinine, Ser: 0.43 mg/dL — ABNORMAL LOW (ref 0.44–1.00)
GFR, Estimated: >60 mL/min (ref >60)
Glucose, Bld: 133 mg/dL — ABNORMAL HIGH (ref 70–99)
Potassium: 3 mmol/L — ABNORMAL LOW (ref 3.5–5.1)
Sodium: 130 mmol/L — ABNORMAL LOW (ref 135–145)
Total Bilirubin: 8 mg/dL — ABNORMAL HIGH (ref 0.0–1.2)
Total Protein: 6.9 g/dL (ref 6.5–8.1)

## 2023-10-08 LAB — CBC WITH DIFFERENTIAL/PLATELET
Abs Immature Granulocytes: 0.1 10*3/uL — ABNORMAL HIGH (ref 0.00–0.07)
Basophils Absolute: 0 10*3/uL (ref 0.0–0.1)
Basophils Relative: 0 %
Eosinophils Absolute: 0 10*3/uL (ref 0.0–0.5)
Eosinophils Relative: 0 %
HCT: 19.1 % — ABNORMAL LOW (ref 36.0–46.0)
Hemoglobin: 6.8 g/dL — CL (ref 12.0–15.0)
Immature Granulocytes: 1 %
Lymphocytes Relative: 8 %
Lymphs Abs: 0.9 10*3/uL (ref 0.7–4.0)
MCH: 41.7 pg — ABNORMAL HIGH (ref 26.0–34.0)
MCHC: 35.6 g/dL (ref 30.0–36.0)
MCV: 117.2 fL — ABNORMAL HIGH (ref 80.0–100.0)
Monocytes Absolute: 1 10*3/uL (ref 0.1–1.0)
Monocytes Relative: 8 %
Neutro Abs: 10 10*3/uL — ABNORMAL HIGH (ref 1.7–7.7)
Neutrophils Relative %: 83 %
Platelets: 122 10*3/uL — ABNORMAL LOW (ref 150–400)
RBC: 1.63 MIL/uL — ABNORMAL LOW (ref 3.87–5.11)
RDW: 19.9 % — ABNORMAL HIGH (ref 11.5–15.5)
WBC: 12 10*3/uL — ABNORMAL HIGH (ref 4.0–10.5)
nRBC: 2.6 % — ABNORMAL HIGH (ref 0.0–0.2)

## 2023-10-08 LAB — HEPATIC FUNCTION PANEL
ALT: 30 U/L (ref 0–44)
AST: 82 U/L — ABNORMAL HIGH (ref 15–41)
Albumin: 3.5 g/dL (ref 3.5–5.0)
Alkaline Phosphatase: 94 U/L (ref 38–126)
Bilirubin, Direct: 3.9 mg/dL — ABNORMAL HIGH (ref 0.0–0.2)
Indirect Bilirubin: 4 mg/dL — ABNORMAL HIGH (ref 0.3–0.9)
Total Bilirubin: 7.9 mg/dL — ABNORMAL HIGH (ref 0.0–1.2)
Total Protein: 7.1 g/dL (ref 6.5–8.1)

## 2023-10-08 LAB — URINE DRUGS OF ABUSE SCREEN W ALC, ROUTINE (REF LAB)
Amphetamines, Urine: NEGATIVE ng/mL
Barbiturate, Ur: NEGATIVE ng/mL
Benzodiazepine Quant, Ur: NEGATIVE ng/mL
Cannabinoid Quant, Ur: NEGATIVE ng/mL
Cocaine (Metab.): NEGATIVE ng/mL
Ethanol U, Quan: NEGATIVE %
Methadone Screen, Urine: NEGATIVE ng/mL
Opiate Quant, Ur: NEGATIVE ng/mL
Phencyclidine, Ur: NEGATIVE ng/mL
Propoxyphene, Urine: NEGATIVE ng/mL

## 2023-10-08 LAB — ENA+DNA/DS+ANTICH+CENTRO+JO...
Anti JO-1: <0.2 AI (ref 0.0–0.9)
Centromere Ab Screen: 4.4 AI — ABNORMAL HIGH (ref 0.0–0.9)
Chromatin Ab SerPl-aCnc: 0.5 AI (ref 0.0–0.9)
ENA SM Ab Ser-aCnc: <0.2 AI (ref 0.0–0.9)
Ribonucleic Protein: 5.1 AI — ABNORMAL HIGH (ref 0.0–0.9)
SSA (Ro) (ENA) Antibody, IgG: <0.2 AI (ref 0.0–0.9)
SSB (La) (ENA) Antibody, IgG: <0.2 AI (ref 0.0–0.9)
Scleroderma (Scl-70) (ENA) Antibody, IgG: <0.2 AI (ref 0.0–0.9)
ds DNA Ab: 4 [IU]/mL (ref 0–9)

## 2023-10-08 LAB — ANTI-SMOOTH MUSCLE ANTIBODY, IGG: F-Actin IgG: 55 U — ABNORMAL HIGH (ref 0–19)

## 2023-10-08 LAB — MITOCHONDRIAL ANTIBODIES: Mitochondrial M2 Ab, IgG: <20 U (ref 0.0–20.0)

## 2023-10-08 LAB — ALPHA-1-ANTITRYPSIN: A-1 Antitrypsin, Ser: 20 mg/dL — ABNORMAL LOW (ref 101–187)

## 2023-10-08 LAB — GLUCOSE, CAPILLARY
Glucose-Capillary: 108 mg/dL — ABNORMAL HIGH (ref 70–99)
Glucose-Capillary: 116 mg/dL — ABNORMAL HIGH (ref 70–99)
Glucose-Capillary: 120 mg/dL — ABNORMAL HIGH (ref 70–99)
Glucose-Capillary: 130 mg/dL — ABNORMAL HIGH (ref 70–99)
Glucose-Capillary: 137 mg/dL — ABNORMAL HIGH (ref 70–99)
Glucose-Capillary: 140 mg/dL — ABNORMAL HIGH (ref 70–99)
Glucose-Capillary: 149 mg/dL — ABNORMAL HIGH (ref 70–99)

## 2023-10-08 LAB — PREPARE RBC (CROSSMATCH)

## 2023-10-08 LAB — HEMOGLOBIN AND HEMATOCRIT, BLOOD
HCT: 29 % — ABNORMAL LOW (ref 36.0–46.0)
Hemoglobin: 10.3 g/dL — ABNORMAL LOW (ref 12.0–15.0)

## 2023-10-08 LAB — PROTIME-INR
INR: 2.3 — ABNORMAL HIGH (ref 0.8–1.2)
Prothrombin Time: 25.2 s — ABNORMAL HIGH (ref 11.4–15.2)

## 2023-10-08 LAB — MAGNESIUM: Magnesium: 2 mg/dL (ref 1.7–2.4)

## 2023-10-08 LAB — ANA W/REFLEX IF POSITIVE: Anti Nuclear Antibody (ANA): POSITIVE — AB

## 2023-10-08 LAB — T3: T3, Total: 62 ng/dL — ABNORMAL LOW (ref 71–180)

## 2023-10-08 LAB — T4: T4, Total: 4.3 ug/dL — ABNORMAL LOW (ref 4.5–12.0)

## 2023-10-08 LAB — HEPATITIS B CORE ANTIBODY, TOTAL: HEP B CORE AB: NEGATIVE

## 2023-10-08 LAB — PHOSPHORUS: Phosphorus: 2.5 mg/dL (ref 2.5–4.6)

## 2023-10-08 MED ORDER — ARFORMOTEROL TARTRATE 15 MCG/2ML IN NEBU
15.0000 ug | INHALATION_SOLUTION | Freq: Two times a day (BID) | RESPIRATORY_TRACT | Status: DC
  Administered 2023-10-08 – 2023-10-10 (×5): 15 ug via RESPIRATORY_TRACT
  Filled 2023-10-08 (×6): qty 2

## 2023-10-08 MED ORDER — DOCUSATE SODIUM 50 MG/5ML PO LIQD: 100.0000 mg | Freq: Every day | ORAL | Status: DC | PRN

## 2023-10-08 MED ORDER — SODIUM CHLORIDE 0.9% IV SOLUTION: Freq: Once | INTRAVENOUS | Status: AC

## 2023-10-08 MED ORDER — PREDNISOLONE 5 MG PO TABS
40.0000 mg | ORAL_TABLET | Freq: Every day | ORAL | Status: DC
  Administered 2023-10-09: 40 mg
  Filled 2023-10-08: qty 8

## 2023-10-08 MED ORDER — POLYETHYLENE GLYCOL 3350 17 G PO PACK: 17.0000 g | PACK | Freq: Every day | ORAL | Status: DC | PRN

## 2023-10-08 MED ORDER — FUROSEMIDE 10 MG/ML IJ SOLN
40.0000 mg | Freq: Once | INTRAMUSCULAR | Status: AC
  Administered 2023-10-08: 40 mg via INTRAVENOUS
  Filled 2023-10-08: qty 4

## 2023-10-08 MED ORDER — REVEFENACIN 175 MCG/3ML IN SOLN
175.0000 ug | Freq: Every day | RESPIRATORY_TRACT | Status: DC
  Administered 2023-10-08 – 2023-10-10 (×3): 175 ug via RESPIRATORY_TRACT
  Filled 2023-10-08 (×3): qty 3

## 2023-10-08 MED ORDER — POTASSIUM CHLORIDE 20 MEQ PO PACK
60.0000 meq | PACK | Freq: Once | ORAL | Status: AC
  Administered 2023-10-08: 60 meq
  Filled 2023-10-08: qty 3

## 2023-10-08 MED ORDER — MIDODRINE HCL 5 MG PO TABS
10.0000 mg | ORAL_TABLET | Freq: Three times a day (TID) | ORAL | Status: DC
  Administered 2023-10-08 – 2023-10-10 (×5): 10 mg
  Filled 2023-10-08 (×5): qty 2

## 2023-10-08 MED ORDER — POTASSIUM CHLORIDE CRYS ER 20 MEQ PO TBCR
20.0000 meq | EXTENDED_RELEASE_TABLET | ORAL | Status: AC
  Administered 2023-10-08: 20 meq via ORAL
  Filled 2023-10-08 (×2): qty 1

## 2023-10-08 MED ORDER — LORAZEPAM 2 MG/ML IJ SOLN
0.5000 mg | INTRAMUSCULAR | Status: DC | PRN
  Administered 2023-10-08 – 2023-10-09 (×4): 0.5 mg via INTRAVENOUS
  Filled 2023-10-08 (×4): qty 1

## 2023-10-08 MED ORDER — ALBUMIN HUMAN 25 % IV SOLN
25.0000 g | Freq: Four times a day (QID) | INTRAVENOUS | Status: AC
  Administered 2023-10-08 – 2023-10-09 (×3): 25 g via INTRAVENOUS
  Filled 2023-10-08 (×3): qty 100

## 2023-10-08 MED ORDER — POTASSIUM CHLORIDE 10 MEQ/100ML IV SOLN
10.0000 meq | INTRAVENOUS | Status: AC
  Administered 2023-10-08 (×4): 10 meq via INTRAVENOUS
  Filled 2023-10-08 (×4): qty 100

## 2023-10-08 NOTE — Progress Notes (Signed)
 [facility] ADULT ICU REPLACEMENT PROTOCOL   The patient does apply for the [facility] Adult ICU Electrolyte Replacment Protocol based on the criteria listed below:   1.Exclusion criteria: TCTS, ECMO, Dialysis, and Myasthenia Gravis patients 2. Is GFR >/= 30 ml/min? Yes.    Patient's GFR today is >60 3. Is SCr </= 2? Yes.   Patient's SCr is 0.43 mg/dL 4. Did SCr increase >/= 0.5 in 24 hours? No. 5.Pt's weight >40kg  Yes.   6. Abnormal electrolyte(s): potassium 3.0  7. Electrolytes replaced per protocol 8.  Call MD STAT for K+ </= 2.5, Phos </= 1, or Mag </= 1 Physician:  protocol  Melvern Banker 10/08/2023 4:48 AM

## 2023-10-08 NOTE — Telephone Encounter (Signed)
 Attempted to contact pt several times on the mobile and the house number but I keep getting a message your call can not be completed at this time please try later.

## 2023-10-08 NOTE — Progress Notes (Signed)
 eLink Physician-Brief Progress Note Patient Name: Stephanie Schmidt DOB: 11/14/1960 MRN: [id number]   Date of Service  10/08/2023  HPI/Events of Note  Nurse concerned that patient may be aspirating as he has suctioned bilious material several times out of patient's mouth since beginning of shift. GI was made aware on dayshift. Lung sound are rhoncorous and crackles. Cortrak inserted earlier in the day.  Imaging reviewed with no note of bowel distension.  eICU Interventions  Since no evidence of bowel distension I don't see the benefit of inserting another feeding tube for decompression. Patient already on PPI. Discussed with BSRN to keep head of bed upright and to continue suctioning as needed.     Intervention Category Intermediate Interventions: Other:  Darl Pikes 10/08/2023, 9:15 PM

## 2023-10-08 NOTE — Progress Notes (Addendum)
 NAME:  Stephanie Schmidt, MRN:  [id number], DOB:  05-Sep-1960, LOS: 2 ADMISSION DATE:  10/06/2023, CONSULTATION DATE:  10/06/23 REFERRING MD:  Dr. Silverio Lay CHIEF COMPLAINT:  Acute Liver Failure   History of Present Illness:  63 year old cachectic female accompanied by her boyfriend and sister.  She has not seen a primary care physician in at least 3 years.  Last seen by [facility] in 2018 for epigastric pain with differential diagnosis of gastritis versus PUD.  Appears the last few years patient's been a heavy alcoholic with his also history of lupus not otherwise specified.  Significant weight loss and cachexia prior to admission.  She is status post splenectomy for unclear reasons   Presented to the ED at [facility] long on 10/06/2023 with worsening confusion over a few months.  Few months ago had a fall and hit her head [CT head negative] also history of decreased oral intake and weight loss and jaundice.  In the ED looked extremely frail and cachectic and dehydrated.  2 L fluid given and requiring 4 mcg of Levophed.  Labs show greater than 15 lactic acid, bilirubin greater than 8, AST 148/ALT 48 and highly concerning for acute alcoholic hepatitis.  With an INR of 2 flu and COVID PCR negative.  CT abdomen showed fatty liver along with very small ascites.  Tylenol level normal   Chest x-ray with some concern of left upper lobe nodule but otherwise clear/ - CT chest with some emphyaema   CCM asked to admit.  Pertinent  Medical History  has a past medical history of Benign hematuria, Collapse, lung, Collar bone fracture, Concussion, Dislocated knee, Hemorrhage in the brain [facility]), Internal hemorrhoids, Lupus, Pulmonary sarcoidosis (HCC), Right hand fracture, and Tubular adenoma of colon.    reports that she quit smoking about 36 years ago. Her smoking use included cigarettes. She has never used smokeless tobacco.  Significant Hospital Events: Including procedures, antibiotic start and stop dates in addition to  other pertinent events   3/2 admitted for shock. Acute alcoholic hepatitis and delirium 3/3 tolerating some PO meds, remains confused  Interim History / Subjective:   Hemoglobin 6.8g/dL this AM, 2 units PRBCs ordered On of levophed Had epistaxis this morning and oozing of her gums Large bowel movement this AM, no blood noted   Objective   Blood pressure 96/83, pulse (!) 101, temperature (!) 97.5 F (36.4 C), temperature source Oral, resp. rate 19, height 5\' 6"  (1.676 m), weight 55.8 kg, SpO2 90%.        Intake/Output Summary (Last 24 hours) at 10/08/2023 0721 Last data filed at 10/08/2023 0703 Gross per 24 hour  Intake 2665.78 ml  Output 1125 ml  Net 1540.78 ml   Filed Weights   10/06/23 1745 10/07/23 0412 10/08/23 0500  Weight: 55.4 kg 55.4 kg 55.8 kg    Examination: General: chronically ill woman, no acute distress HENT: [place]/AT, scleral icterus Lungs: diminished breath sounds Cardiovascular: rrr, no murmurs Abdomen: soft, non-tender, BS+ Extremities: warm, 2+ edema Neuro: somnolent, moving all extremities GU: foley  Labs  alpha-1 antitrypsin level 20 INR 2.3 Plts 122, Hgb 6.8, WBC 12  Resolved Hospital Problem list     Assessment & Plan:  Shock - hypovolemic vs septic vs adrenal insufficiency - Echo is normal - continue antibiotic coverage with ceftriaxone and flagyl - Follow up cultures - MAP goal 65 or greater - continue peripheral levophed - midodrine 10mg  TID - prednisolone for alcohol hepatitis will help with adrenal insufficiency  Acute Hypoxemic Respiratory Failure Centrilobular Emphysema, Mild - Goal SpO2 92% or higher, wean O2 as able - check chest x-ray - concern for aspiration and volume overload given her mental status and resuscitation - start brovana and yupelri nebs  Acute Metabolic Encephalopathy - CT head unremarkable - ammonia slightly elevated, start lactulose 30mg  TID per tube - avoid sedating meds  AKI Lactic  Acidosis Hyopkalemia Hyponatremia - Na slowly improving with volume resuscitation - replete potassium  Alcoholic Hepatitis Cirrhosis Ascites Alpha-1 antitrypsin level is low, possible contributing factor to cirrhosis along with alcohol abuse. - GI following - trend LFTs and bilirubin level - prednisolone 40mg  daily, started 3/2  - lactulose 30g TID  Anemia Thrombocytopenia Coagulopathy, INR 2.3 Epistaxis noted this morning. No retroperitoneal hematoma on CT imaging on 3/2. Component of hemodilution, she is 8L positive since admission. - trend H/H and platelets - transfuse for hemoglobin 7g/dL or less - give 2 units PRBCs today - vitamin K supplementation - check DIC panel this afternoon  Severe protein calorie malnutrition - Will need cortrak placed today as she is unable to take PO at this time due to mental status - nutrition consulted    Best Practice (right click and "Reselect all SmartList Selections" daily)   Diet/type: tubefeeds DVT prophylaxis SCD Pressure ulcer(s): N/A GI prophylaxis: PPI Lines: N/A Foley:  Yes, and it is still needed Code Status:  full code Last date of multidisciplinary goals of care discussion [will consult palliative care today]  Labs   CBC: Recent Labs  Lab 10/06/23 1037 10/06/23 1804 10/06/23 2020 10/07/23 0249 10/08/23 0307  WBC 9.4  --  14.2* 13.2* 12.0*  NEUTROABS 7.6  --   --  10.5* 10.0*  HGB 12.9 8.2* 8.0* 8.3* 6.8*  HCT 35.8* 24.0* 21.8* 23.1* 19.1*  MCV 114.4*  --  114.1* 117.3* 117.2*  PLT 140*  --  141* 143* 122*    Basic Metabolic Panel: Recent Labs  Lab 10/06/23 1245 10/06/23 1804 10/06/23 1851 10/06/23 2131 10/07/23 0249 10/08/23 0307  NA 125* 128* 126*  --  129* 130*  K 3.8 3.5 3.4*  --  3.6 3.0*  CL 88*  --  89*  --  93* 94*  CO2 23  --  26  --  24 25  GLUCOSE 91  --  112*  --  145* 133*  BUN 13  --  10  --  9 <5*  CREATININE 1.13*  --  0.88  --  0.66 0.43*  CALCIUM 8.5*  --  8.4*  --  8.5* 8.7*   MG  --   --   --  1.3* 2.7* 2.0  PHOS  --   --   --  2.3* 2.8 2.5   GFR: Estimated Creatinine Clearance: 63.4 mL/min (A) (by C-G formula based on SCr of 0.43 mg/dL (L)). Recent Labs  Lab 10/06/23 1037 10/06/23 1626 10/06/23 1851 10/06/23 2020 10/07/23 0249 10/08/23 0307  PROCALCITON  --   --  0.30  --  0.29  --   WBC 9.4  --   --  14.2* 13.2* 12.0*  LATICACIDVEN  --  >15.0* 3.3*  --   --   --     Liver Function Tests: Recent Labs  Lab 10/06/23 1245 10/06/23 1851 10/07/23 0249 10/08/23 0307  AST 140* 129* 125* 89*  ALT 48* 46* 45* 33  ALKPHOS 145* 135* 146* 93  BILITOT 8.6* 8.4* 9.4* 8.0*  PROT 7.4 6.9 7.3 6.9  ALBUMIN 1.9* 1.7*  2.0* 3.3*   Recent Labs  Lab 10/06/23 1851  LIPASE 31  AMYLASE 40   Recent Labs  Lab 10/06/23 1318 10/06/23 1851  AMMONIA 19 53*    ABG    Component Value Date/Time   PHART 7.606 (HH) 10/06/2023 1804   PCO2ART 27.8 (L) 10/06/2023 1804   PO2ART 29 (LL) 10/06/2023 1804   HCO3 27.8 10/06/2023 1804   TCO2 29 10/06/2023 1804   O2SAT 72 10/06/2023 1804     Coagulation Profile: Recent Labs  Lab 10/06/23 1037 10/06/23 1851 10/07/23 0249 10/08/23 0307  INR 2.0* 2.0* 2.0* 2.3*    Cardiac Enzymes: No results for input(s): "CKTOTAL", "CKMB", "CKMBINDEX", "TROPONINI" in the last 168 hours.  HbA1C: Hgb A1c MFr Bld  Date/Time Value Ref Range Status  07/28/2021 10:10 AM 5.1 4.6 - 6.5 % Final    Comment:    Glycemic Control Guidelines for People with Diabetes:Non Diabetic:  <6%Goal of Therapy: <7%Additional Action Suggested:  >8%     CBG: Recent Labs  Lab 10/07/23 1139 10/07/23 1619 10/07/23 1956 10/07/23 2343 10/08/23 0344  GLUCAP 140* 199* 119* 122* 137*      Critical care time: 40 minutes    The patient Stephanie Schmidt is critically ill with multiple organ systems failure and requires high complexity decision making for assessment and support, frequent evaluation and titration of therapies, application of  advanced monitoring technologies and extensive interpretation of multiple databases.   Melody Comas, MD  Pulmonary & Critical Care Office: [phone]   See Amion for personal pager PCCM on call pager [phone] until 7pm. Please call Elink 7p-7a. [phone]

## 2023-10-08 NOTE — Progress Notes (Signed)
 Chaplain received a referral from palliative care to provide Stephanie Schmidt and her sister with emotional and spiritual support.  They are appreciative of prayer and have also been in touch with their church to see if their own pastor can come to see them as well and to have [name] added to their prayer list.  [name], Bcc Pager, [phone]

## 2023-10-08 NOTE — Plan of Care (Signed)

## 2023-10-08 NOTE — Plan of Care (Signed)
  Problem: Elimination: Goal: Will not experience complications related to bowel motility Outcome: Progressing   Problem: Health Behavior/Discharge Planning: Goal: Ability to manage health-related needs will improve Outcome: Not Progressing   Problem: Skin Integrity: Goal: Risk for impaired skin integrity will decrease Outcome: Not Progressing   Problem: Clinical Measurements: Goal: Respiratory complications will improve Outcome: Not Progressing Goal: Cardiovascular complication will be avoided Outcome: Not Progressing   Problem: Nutrition: Goal: Adequate nutrition will be maintained Outcome: Not Progressing   Problem: Coping: Goal: Level of anxiety will decrease Outcome: Not Progressing
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
## 2023-10-08 NOTE — Progress Notes (Addendum)
 Leanora Cover visited with patient's three family members. Dava Najjar previously visited with patient's family. This is a follow-up visit.
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
## 2023-10-09 ENCOUNTER — Inpatient Hospital Stay (HOSPITAL_COMMUNITY)

## 2023-10-09 DIAGNOSIS — K7290 Hepatic failure, unspecified without coma: Secondary | ICD-10-CM | POA: Diagnosis not present

## 2023-10-09 DIAGNOSIS — K7010 Alcoholic hepatitis without ascites: Secondary | ICD-10-CM | POA: Diagnosis not present

## 2023-10-09 DIAGNOSIS — Z7189 Other specified counseling: Secondary | ICD-10-CM | POA: Diagnosis not present

## 2023-10-09 DIAGNOSIS — R579 Shock, unspecified: Secondary | ICD-10-CM | POA: Diagnosis not present

## 2023-10-09 DIAGNOSIS — Z515 Encounter for palliative care: Secondary | ICD-10-CM | POA: Diagnosis not present

## 2023-10-09 DIAGNOSIS — D696 Thrombocytopenia, unspecified: Secondary | ICD-10-CM | POA: Diagnosis not present

## 2023-10-09 DIAGNOSIS — G9341 Metabolic encephalopathy: Secondary | ICD-10-CM | POA: Diagnosis not present

## 2023-10-09 DIAGNOSIS — N179 Acute kidney failure, unspecified: Secondary | ICD-10-CM | POA: Diagnosis not present

## 2023-10-09 DIAGNOSIS — D649 Anemia, unspecified: Secondary | ICD-10-CM | POA: Diagnosis not present

## 2023-10-09 LAB — BPAM RBC
Blood Product Expiration Date: 202503272359
Blood Product Expiration Date: 202503302359
ISSUE DATE / TIME: 202503041115
ISSUE DATE / TIME: 202503041619
Unit Type and Rh: 5100
Unit Type and Rh: 5100

## 2023-10-09 LAB — COMPREHENSIVE METABOLIC PANEL
ALT: 31 U/L (ref 0–44)
AST: 73 U/L — ABNORMAL HIGH (ref 15–41)
Albumin: 4.3 g/dL (ref 3.5–5.0)
Alkaline Phosphatase: 90 U/L (ref 38–126)
Anion gap: 13 (ref 5–15)
BUN: <5 mg/dL — ABNORMAL LOW (ref 8–23)
CO2: 26 mmol/L (ref 22–32)
Calcium: 9 mg/dL (ref 8.9–10.3)
Chloride: 95 mmol/L — ABNORMAL LOW (ref 98–111)
Creatinine, Ser: 0.38 mg/dL — ABNORMAL LOW (ref 0.44–1.00)
GFR, Estimated: >60 mL/min (ref >60)
Glucose, Bld: 126 mg/dL — ABNORMAL HIGH (ref 70–99)
Potassium: 3 mmol/L — ABNORMAL LOW (ref 3.5–5.1)
Sodium: 134 mmol/L — ABNORMAL LOW (ref 135–145)
Total Bilirubin: 8.8 mg/dL — ABNORMAL HIGH (ref 0.0–1.2)
Total Protein: 7.7 g/dL (ref 6.5–8.1)

## 2023-10-09 LAB — BASIC METABOLIC PANEL
Anion gap: 16 — ABNORMAL HIGH (ref 5–15)
BUN: <5 mg/dL — ABNORMAL LOW (ref 8–23)
CO2: 25 mmol/L (ref 22–32)
Calcium: 9.2 mg/dL (ref 8.9–10.3)
Chloride: 97 mmol/L — ABNORMAL LOW (ref 98–111)
Creatinine, Ser: 0.53 mg/dL (ref 0.44–1.00)
GFR, Estimated: >60 mL/min (ref >60)
Glucose, Bld: 111 mg/dL — ABNORMAL HIGH (ref 70–99)
Potassium: 3.6 mmol/L (ref 3.5–5.1)
Sodium: 138 mmol/L (ref 135–145)

## 2023-10-09 LAB — GLUCOSE, CAPILLARY
Glucose-Capillary: 139 mg/dL — ABNORMAL HIGH (ref 70–99)
Glucose-Capillary: 97 mg/dL (ref 70–99)
Glucose-Capillary: 118 mg/dL — ABNORMAL HIGH (ref 70–99)
Glucose-Capillary: 104 mg/dL — ABNORMAL HIGH (ref 70–99)
Glucose-Capillary: 142 mg/dL — ABNORMAL HIGH (ref 70–99)
Glucose-Capillary: 131 mg/dL — ABNORMAL HIGH (ref 70–99)

## 2023-10-09 LAB — CBC WITH DIFFERENTIAL/PLATELET
Abs Immature Granulocytes: 0.24 10*3/uL — ABNORMAL HIGH (ref 0.00–0.07)
Basophils Absolute: 0 10*3/uL (ref 0.0–0.1)
Basophils Relative: 0 %
Eosinophils Absolute: 0 10*3/uL (ref 0.0–0.5)
Eosinophils Relative: 0 %
HCT: 30.3 % — ABNORMAL LOW (ref 36.0–46.0)
Hemoglobin: 11 g/dL — ABNORMAL LOW (ref 12.0–15.0)
Immature Granulocytes: 1 %
Lymphocytes Relative: 3 %
Lymphs Abs: 0.7 10*3/uL (ref 0.7–4.0)
MCH: 37.5 pg — ABNORMAL HIGH (ref 26.0–34.0)
MCHC: 36.3 g/dL — ABNORMAL HIGH (ref 30.0–36.0)
MCV: 103.4 fL — ABNORMAL HIGH (ref 80.0–100.0)
Monocytes Absolute: 1 10*3/uL (ref 0.1–1.0)
Monocytes Relative: 5 %
Neutro Abs: 18.2 10*3/uL — ABNORMAL HIGH (ref 1.7–7.7)
Neutrophils Relative %: 91 %
Platelets: 105 10*3/uL — ABNORMAL LOW (ref 150–400)
RBC: 2.93 MIL/uL — ABNORMAL LOW (ref 3.87–5.11)
RDW: 24.4 % — ABNORMAL HIGH (ref 11.5–15.5)
WBC: 20.1 10*3/uL — ABNORMAL HIGH (ref 4.0–10.5)
nRBC: 4.2 % — ABNORMAL HIGH (ref 0.0–0.2)

## 2023-10-09 LAB — MAGNESIUM
Magnesium: 1.6 mg/dL — ABNORMAL LOW (ref 1.7–2.4)
Magnesium: 2.1 mg/dL (ref 1.7–2.4)

## 2023-10-09 LAB — TYPE AND SCREEN
ABO/RH(D): O POS
Antibody Screen: NEGATIVE
Unit division: 0
Unit division: 0

## 2023-10-09 LAB — PHOSPHORUS: Phosphorus: 2.3 mg/dL — ABNORMAL LOW (ref 2.5–4.6)

## 2023-10-09 LAB — PROTIME-INR
INR: 2.8 — ABNORMAL HIGH (ref 0.8–1.2)
Prothrombin Time: 30 s — ABNORMAL HIGH (ref 11.4–15.2)

## 2023-10-09 LAB — AFP TUMOR MARKER: AFP, Serum, Tumor Marker: 3 ng/mL (ref 0.0–9.2)

## 2023-10-09 MED ORDER — VITAL HIGH PROTEIN PO LIQD
1000.0000 mL | ORAL | Status: DC
  Administered 2023-10-09 – 2023-10-10 (×2): 1000 mL

## 2023-10-09 MED ORDER — GLYCOPYRROLATE 0.2 MG/ML IJ SOLN
0.1000 mg | Freq: Once | INTRAMUSCULAR | Status: AC
  Administered 2023-10-09: 0.1 mg via INTRAVENOUS
  Filled 2023-10-09: qty 1

## 2023-10-09 MED ORDER — DEXMEDETOMIDINE HCL IN NACL 200 MCG/50ML IV SOLN
0.0000 ug/kg/h | INTRAVENOUS | Status: DC
Start: 2023-10-09 — End: 2023-10-11

## 2023-10-09 MED ORDER — POTASSIUM CHLORIDE 20 MEQ PO PACK
40.0000 meq | PACK | Freq: Three times a day (TID) | ORAL | Status: AC
  Administered 2023-10-09 – 2023-10-10 (×4): 40 meq
  Filled 2023-10-09 (×4): qty 2

## 2023-10-09 MED ORDER — MAGNESIUM SULFATE 2 GM/50ML IV SOLN
2.0000 g | Freq: Once | INTRAVENOUS | Status: AC
  Administered 2023-10-09: 2 g via INTRAVENOUS
  Filled 2023-10-09: qty 50

## 2023-10-09 MED ORDER — SPIRONOLACTONE 25 MG PO TABS
25.0000 mg | ORAL_TABLET | Freq: Once | ORAL | Status: AC
  Administered 2023-10-09: 25 mg
  Filled 2023-10-09 (×2): qty 1

## 2023-10-09 MED ORDER — POTASSIUM CHLORIDE 10 MEQ/100ML IV SOLN
10.0000 meq | INTRAVENOUS | Status: DC
  Administered 2023-10-09 (×4): 10 meq via INTRAVENOUS
  Filled 2023-10-09 (×4): qty 100

## 2023-10-09 MED ORDER — SODIUM CHLORIDE 0.9 % IV SOLN
2.0000 g | Freq: Three times a day (TID) | INTRAVENOUS | Status: DC
  Administered 2023-10-09 – 2023-10-10 (×4): 2 g via INTRAVENOUS
  Filled 2023-10-09 (×4): qty 12.5

## 2023-10-09 MED ORDER — FUROSEMIDE 10 MG/ML IJ SOLN
60.0000 mg | Freq: Four times a day (QID) | INTRAMUSCULAR | Status: AC
  Administered 2023-10-09 – 2023-10-10 (×3): 60 mg via INTRAVENOUS
  Filled 2023-10-09 (×3): qty 6

## 2023-10-09 MED ORDER — METRONIDAZOLE 500 MG/100ML IV SOLN
500.0000 mg | Freq: Two times a day (BID) | INTRAVENOUS | Status: DC
  Administered 2023-10-09 – 2023-10-10 (×2): 500 mg via INTRAVENOUS
  Filled 2023-10-09 (×2): qty 100

## 2023-10-09 MED ORDER — LORAZEPAM 2 MG/ML IJ SOLN: 1.0000 mg | INTRAMUSCULAR | Status: DC | PRN

## 2023-10-09 MED ORDER — FENTANYL CITRATE PF 50 MCG/ML IJ SOSY
25.0000 ug | PREFILLED_SYRINGE | INTRAMUSCULAR | Status: DC | PRN
  Administered 2023-10-09: 50 ug via INTRAVENOUS
  Administered 2023-10-09: 25 ug via INTRAVENOUS
  Administered 2023-10-10 (×6): 50 ug via INTRAVENOUS
  Filled 2023-10-09 (×8): qty 1

## 2023-10-09 MED ORDER — POTASSIUM CHLORIDE 20 MEQ PO PACK
20.0000 meq | PACK | ORAL | Status: DC
  Filled 2023-10-09: qty 1

## 2023-10-09 MED ORDER — FUROSEMIDE 10 MG/ML IJ SOLN
80.0000 mg | Freq: Once | INTRAMUSCULAR | Status: AC
  Administered 2023-10-09: 80 mg via INTRAVENOUS
  Filled 2023-10-09: qty 8

## 2023-10-09 NOTE — Progress Notes (Signed)
 [facility] ADULT ICU REPLACEMENT PROTOCOL   The patient does apply for the [facility] Adult ICU Electrolyte Replacment Protocol based on the criteria listed below:   1.Exclusion criteria: TCTS, ECMO, Dialysis, and Myasthenia Gravis patients 2. Is GFR >/= 30 ml/min? Yes.    Patient's GFR today is >60 3. Is SCr </= 2? Yes.   Patient's SCr is 0.38 mg/dL 4. Did SCr increase >/= 0.5 in 24 hours? No. 5.Pt's weight >40kg  Yes.   6. Abnormal electrolyte(s): potassium 3.0  7. Electrolytes replaced per protocol 8.  Call MD STAT for K+ </= 2.5, Phos </= 1, or Mag </= 1 Physician:  protocol  Melvern Banker 10/09/2023 4:55 AM

## 2023-10-09 NOTE — Progress Notes (Signed)
 Palliative Medicine Inpatient Follow Up Note   HPI: 63 year old cachectic female accompanied by her boyfriend and sister. She has not seen a primary care physician in at least 3 years. Last seen by [facility] in 2018 for epigastric pain with differential diagnosis of gastritis versus PUD. Palliative care asked to get involved to support additional goals of care conversations.   Today's Discussion 10/09/2023  *Please note that this is a verbal dictation therefore any spelling or grammatical errors are due to the "Dragon Medical One" system interpretation.  Chart reviewed inclusive of vital signs, progress notes, laboratory results, and diagnostic images.   On assessment this morning Stephanie Schmidt is looking poorly overall. She has ongoing frothy sputum production that is amber color. This is thought to be related to her elevated bilirubin. She is critically ill appearing, pressor needs have increased.  I called patients sister, Stephanie Schmidt to come in to discuss goals.  I met with Stephanie Schmidt this afternoon. Created space and opportunity for Stephanie Schmidt to explore thoughts feelings and fears regarding her sisters current medical situation. We reviewed Stephanie Schmidt's multisystem organ dysfunction. We discussed her clinical health and how poor it likely has been for a period of time now. We reviewed how little reserve Stephanie Schmidt's body has in the setting of this shock.  I shared openly and honestly my concerns that Stephanie Schmidt is unlikely to survive this hospitalization.  Stephanie Schmidt shares the she has multiple family members who plan to visit over the next day. She states that if Stephanie Schmidt neglects to improve with present measures then the goals will transition to keeping her comfortable. I shared with Stephanie Schmidt anything may change from now to then and if Stephanie Schmidt should worsen, I would recommend comfort sooner than later. She understood this.   Offer time and space given how shocking the patients situation is for her family.    Questions and concerns addressed/Palliative Support Provided.   Objective Assessment: Vital Signs Vitals:   10/09/23 1335 10/09/23 1345  BP:  (!) 94/47  Pulse: (!) 109 (!) 107  Resp: (!) 21 (!) 22  Temp:    SpO2: 98% 97%    Intake/Output Summary (Last 24 hours) at 10/09/2023 1404 Last data filed at 10/09/2023 1339 Gross per 24 hour  Intake 1986.69 ml  Output 3355 ml  Net -1368.31 ml   Last Weight  Most recent update: 10/09/2023  4:06 AM    Weight  57.5 kg (126 lb 12.2 oz)            Gen:  Older AA F critically ill appearing HEENT: Coretrack, amber frothy sputum production CV: Regular rate and rhythm  PULM: On 40LPM HFNC, breathing is mildly labored ABD: soft/nontender EXT: No edema  Neuro: Disoriented  SUMMARY OF RECOMMENDATIONS   DNAR/DNI   Allowing time for outcomes - family is hopeful patients illness can improve with present measures though have decided to allow her 24-48 hours more to see if she can turn around   Should patient continue to decline - her sister understands that comfort would then be the option to ensure no suffering   Appreciate  ongoing Chaplain involvement    Ongoing PMT support  Time Spent: 50 ______________________________________________________________________________________ Lamarr Lulas [facility] Pall[facility] Team Team Cell Phone: [phone] Please utilize secure chat with additional questions, if there is no response within 30 minutes please call the above phone number  Palliative Medicine Team providers are available by phone from 7am to 7pm daily and can be reached through the team cell phone.  Should this patient require assistance outside of these hours, please call the patient's attending physician.

## 2023-10-09 NOTE — Progress Notes (Signed)
 eLink Physician-Brief Progress Note Patient Name: Stephanie Schmidt DOB: June 30, 1961 MRN: [id number]   Date of Service  10/09/2023  HPI/Events of Note  Patient continues to have bilious output. Also getting more agitated despite ativan 0.5 mg q 1. Received request to increase dose.  eICU Interventions  Stat CXR and KUB ordered Increase Ativan to 1 mg for now Bedside CCM team notified     Intervention Category Intermediate Interventions: Respiratory distress - evaluation and management Minor Interventions: Agitation / anxiety - evaluation and management  Rosalie Gums Lorell Thibodaux 10/09/2023, 2:52 AM
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
## 2023-10-09 NOTE — Plan of Care (Signed)
  Problem: Metabolic: Goal: Ability to maintain appropriate glucose levels will improve Outcome: Progressing   Problem: Coping: Goal: Ability to adjust to condition or change in health will improve Outcome: Not Progressing   Problem: Nutritional: Goal: Maintenance of adequate nutrition will improve Outcome: Not Progressing   Problem: Skin Integrity: Goal: Risk for impaired skin integrity will decrease Outcome: Not Progressing   Problem: Education: Goal: Knowledge of General Education information will improve Description: Including pain rating scale, medication(s)/side effects and non-pharmacologic comfort measures Outcome: Not Progressing   Problem: Clinical Measurements: Goal: Diagnostic test results will improve Outcome: Not Progressing Goal: Respiratory complications will improve Outcome: Not Progressing Goal: Cardiovascular complication will be avoided Outcome: Not Progressing   Problem: Nutrition: Goal: Adequate nutrition will be maintained Outcome: Not Progressing   Problem: Coping: Goal: Level of anxiety will decrease Outcome: Not Progressing   Problem: Elimination: Goal: Will not experience complications related to bowel motility Outcome: Not Progressing   Problem: Pain Managment: Goal: General experience of comfort will improve and/or be controlled Outcome: Not Progressing
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
## 2023-10-09 NOTE — Progress Notes (Signed)
 Pharmacy Antibiotic Note  Stephanie Schmidt is a 63 y.o. female admitted on 10/06/2023 with acute liver failure.  She was initially started on ceftriaxone and metronidazole for sepsis.  Pharmacy has been consulted for escalation of ceftriaxone to Cefepime dosing for aspiration pneumonia.    Plan: D/C ceftriaxone Continue metronidazole Cefepime 2g IV q8h Follow up renal function, culture results, and clinical course.   Height: 5\' 6"  (167.6 cm) Weight: 57.5 kg (126 lb 12.2 oz) IBW/kg (Calculated) : 59.3  Temp (24hrs), Avg:97.5 F (36.4 C), Min:96.7 F (35.9 C), Max:98.4 F (36.9 C)  Recent Labs  Lab 10/06/23 1037 10/06/23 1245 10/06/23 1626 10/06/23 1851 10/06/23 2020 10/07/23 0249 10/08/23 0307 10/09/23 0310  WBC 9.4  --   --   --  14.2* 13.2* 12.0* 20.1*  CREATININE  --  1.13*  --  0.88  --  0.66 0.43* 0.38*  LATICACIDVEN  --   --  >15.0* 3.3*  --   --   --   --     Estimated Creatinine Clearance: 65.3 mL/min (A) (by C-G formula based on SCr of 0.38 mg/dL (L)).    Allergies  Allergen Reactions   Clindamycin Swelling    Swelling of tongue   Codeine Nausea And Vomiting   Penicillins Hives   Hydrocodone Nausea And Vomiting    Antimicrobials this admission: 3/2 cefepime >> 3/3, resumed 3/5 >>  3/2 metronidazole, resumed 3/3 >> 3/2 Vancomycin x1 3/3 Ceftriaxone >> 3/5  Microbiology results:  3/2 Resp: neg covid, flu, rsv 3/2 MRSA PCR: not detected 3/2 BCx: ngtd  Thank you for allowing pharmacy to be a part of this patient's care.  Lynann Beaver PharmD, BCPS [facility] main pharmacy [phone] 10/09/2023 11:26 AM

## 2023-10-09 NOTE — Progress Notes (Signed)
 NAME:  Stephanie Schmidt, MRN:  [id number], DOB:  06/20/1961, LOS: 3 ADMISSION DATE:  10/06/2023, CONSULTATION DATE:  10/06/23 REFERRING MD:  Dr. Silverio Lay CHIEF COMPLAINT:  Acute Liver Failure   History of Present Illness:  63 year old cachectic female accompanied by her boyfriend and sister.  She has not seen a primary care physician in at least 3 years.  Last seen by [facility] in 2018 for epigastric pain with differential diagnosis of gastritis versus PUD.  Appears the last few years patient's been a heavy alcoholic with his also history of lupus not otherwise specified.  Significant weight loss and cachexia prior to admission.  She is status post splenectomy for unclear reasons   Presented to the ED at [facility] long on 10/06/2023 with worsening confusion over a few months.  Few months ago had a fall and hit her head [CT head negative] also history of decreased oral intake and weight loss and jaundice.  In the ED looked extremely frail and cachectic and dehydrated.  2 L fluid given and requiring 4 mcg of Levophed.  Labs show greater than 15 lactic acid, bilirubin greater than 8, AST 148/ALT 48 and highly concerning for acute alcoholic hepatitis.  With an INR of 2 flu and COVID PCR negative.  CT abdomen showed fatty liver along with very small ascites.  Tylenol level normal   Chest x-ray with some concern of left upper lobe nodule but otherwise clear/ - CT chest with some emphyaema   CCM asked to admit.  Pertinent  Medical History  has a past medical history of Benign hematuria, Collapse, lung, Collar bone fracture, Concussion, Dislocated knee, Hemorrhage in the brain [facility]), Internal hemorrhoids, Lupus, Pulmonary sarcoidosis (HCC), Right hand fracture, and Tubular adenoma of colon.    reports that she quit smoking about 36 years ago. Her smoking use included cigarettes. She has never used smokeless tobacco.  Significant Hospital Events: Including procedures, antibiotic start and stop dates in addition to  other pertinent events   3/2 admitted for shock. Acute alcoholic hepatitis and delirium 3/3 tolerating some PO meds, remains confused 3/4 more altered, NG tube placed, developed pulmonary edema and increasing O2 requirements, given 2 units of PRBCs, intermittently agitated  Interim History / Subjective:   Off levophed this morning Having copious amounts oral foam requiring suctioning Remains altered   Objective   Blood pressure (!) 94/59, pulse (!) 116, temperature (!) 96.7 F (35.9 C), temperature source Axillary, resp. rate (!) 27, height 5\' 6"  (1.676 m), weight 57.5 kg, SpO2 92%.    FiO2 (%):  [100 %] 100 %   Intake/Output Summary (Last 24 hours) at 10/09/2023 0738 Last data filed at 10/09/2023 4098 Gross per 24 hour  Intake 2042.8 ml  Output 1545 ml  Net 497.8 ml   Filed Weights   10/07/23 0412 10/08/23 0500 10/09/23 0405  Weight: 55.4 kg 55.8 kg 57.5 kg    Examination: General: chronically ill woman, mildly agitated HENT: [place]/AT, scleral icterus Lungs: diminished breath sounds Cardiovascular: rrr, no murmurs Abdomen: soft, non-tender, BS+ Extremities: warm, + edema Neuro: mildly agitated, moving all extremities GU: foley  Labs  alpha-1 antitrypsin level 20 INR 2.3 Plts 122, Hgb 6.8, WBC 12  Resolved Hospital Problem list     Assessment & Plan:  Shock - hypovolemic vs septic vs adrenal insufficiency - improved after blood transfusions yesterday - Echo is normal - continue antibiotic coverage with ceftriaxone and flagyl - Follow up cultures - MAP goal 65 or greater - continue  peripheral levophed as needed - midodrine 10mg  TID per tube - prednisolone for alcohol hepatitis will help with adrenal insufficiency  Acute Hypoxemic Respiratory Failure Acute Pulmonary Edema Concern for aspiration pneumonia Centrilobular Emphysema, Mild - Goal SpO2 92% or higher, wean O2 as able -Chest x-ray with bilateral pulmonary congestion and infiltrates - on ceftriaxone and  flagyl - aggressive diuresis today with lasix and spironolactone - brovana and yupelri nebs  Acute Metabolic Encephalopathy Agitation - CT head unremarkable - continue lactulose 30mg  TID per tube - avoid sedating meds - precedex PRN for agitaiton  AKI Lactic Acidosis Hyopkalemia Hyponatremia - Na slowly improving - replete potassium  Alcoholic Hepatitis Cirrhosis Ascites Alpha-1 antitrypsin level is low, possible contributing factor to cirrhosis along with alcohol abuse. - GI following - trend LFTs and bilirubin level - prednisolone 40mg  daily, started 3/2  - lactulose 30g TID - start spironolactone 25mg  daily per tube  Anemia Thrombocytopenia Coagulopathy, INR 2.8 Epistaxis noted yesterday. No retroperitoneal hematoma on CT imaging on 3/2. Component of hemodilution, she is 8L positive since admission. - trend H/H and platelets - transfuse for hemoglobin 7g/dL or less - given 2 units PRBCs yesterday  Severe protein calorie malnutrition - trickle tube feeds - nutrition consulted    Best Practice (right click and "Reselect all SmartList Selections" daily)   Diet/type: tubefeeds DVT prophylaxis SCD Pressure ulcer(s): N/A GI prophylaxis: PPI Lines: N/A Foley:  Yes, and it is still needed Code Status:  full code Last date of multidisciplinary goals of care discussion [palliative care is following, appreciate assistance. Patient is DNR/DNI]  Labs   CBC: Recent Labs  Lab 10/06/23 1037 10/06/23 1804 10/06/23 2020 10/07/23 0249 10/08/23 0307 10/08/23 2109 10/09/23 0310  WBC 9.4  --  14.2* 13.2* 12.0*  --  20.1*  NEUTROABS 7.6  --   --  10.5* 10.0*  --  18.2*  HGB 12.9   < > 8.0* 8.3* 6.8* 10.3* 11.0*  HCT 35.8*   < > 21.8* 23.1* 19.1* 29.0* 30.3*  MCV 114.4*  --  114.1* 117.3* 117.2*  --  103.4*  PLT 140*  --  141* 143* 122*  --  105*   < > = values in this interval not displayed.    Basic Metabolic Panel: Recent Labs  Lab 10/06/23 1245  10/06/23 1804 10/06/23 1851 10/06/23 2131 10/07/23 0249 10/08/23 0307 10/09/23 0310  NA 125* 128* 126*  --  129* 130* 134*  K 3.8 3.5 3.4*  --  3.6 3.0* 3.0*  CL 88*  --  89*  --  93* 94* 95*  CO2 23  --  26  --  24 25 26   GLUCOSE 91  --  112*  --  145* 133* 126*  BUN 13  --  10  --  9 <5* <5*  CREATININE 1.13*  --  0.88  --  0.66 0.43* 0.38*  CALCIUM 8.5*  --  8.4*  --  8.5* 8.7* 9.0  MG  --   --   --  1.3* 2.7* 2.0  --   PHOS  --   --   --  2.3* 2.8 2.5  --    GFR: Estimated Creatinine Clearance: 65.3 mL/min (A) (by C-G formula based on SCr of 0.38 mg/dL (L)). Recent Labs  Lab 10/06/23 1626 10/06/23 1851 10/06/23 2020 10/07/23 0249 10/08/23 0307 10/09/23 0310  PROCALCITON  --  0.30  --  0.29  --   --   WBC  --   --  14.2* 13.2* 12.0* 20.1*  LATICACIDVEN >15.0* 3.3*  --   --   --   --     Liver Function Tests: Recent Labs  Lab 10/06/23 1851 10/07/23 0249 10/08/23 0307 10/08/23 0939 10/09/23 0310  AST 129* 125* 89* 82* 73*  ALT 46* 45* 33 30 31  ALKPHOS 135* 146* 93 94 90  BILITOT 8.4* 9.4* 8.0* 7.9* 8.8*  PROT 6.9 7.3 6.9 7.1 7.7  ALBUMIN 1.7* 2.0* 3.3* 3.5 4.3   Recent Labs  Lab 10/06/23 1851  LIPASE 31  AMYLASE 40   Recent Labs  Lab 10/06/23 1318 10/06/23 1851  AMMONIA 19 53*    ABG    Component Value Date/Time   PHART 7.606 (HH) 10/06/2023 1804   PCO2ART 27.8 (L) 10/06/2023 1804   PO2ART 29 (LL) 10/06/2023 1804   HCO3 27.8 10/06/2023 1804   TCO2 29 10/06/2023 1804   O2SAT 72 10/06/2023 1804     Coagulation Profile: Recent Labs  Lab 10/06/23 1037 10/06/23 1851 10/07/23 0249 10/08/23 0307 10/09/23 0310  INR 2.0* 2.0* 2.0* 2.3* 2.8*    Cardiac Enzymes: No results for input(s): "CKTOTAL", "CKMB", "CKMBINDEX", "TROPONINI" in the last 168 hours.  HbA1C: Hgb A1c MFr Bld  Date/Time Value Ref Range Status  07/28/2021 10:10 AM 5.1 4.6 - 6.5 % Final    Comment:    Glycemic Control Guidelines for People with Diabetes:Non Diabetic:   <6%Goal of Therapy: <7%Additional Action Suggested:  >8%     CBG: Recent Labs  Lab 10/08/23 1540 10/08/23 1716 10/08/23 2002 10/08/23 2341 10/09/23 0408  GLUCAP 140* 116* 149* 120* 139*      Critical care time: 40 minutes    The patient Stephanie Schmidt is critically ill with multiple organ systems failure and requires high complexity decision making for assessment and support, frequent evaluation and titration of therapies, application of advanced monitoring technologies and extensive interpretation of multiple databases.   Melody Comas, MD [facility] Office: [phone]   See Amion for personal pager PCCM on call pager [phone] until 7pm. Please call Elink 7p-7a. [phone]

## 2023-10-09 NOTE — Progress Notes (Signed)
 Pt required nasotracheal suctioning due to not being able to clear secretions.  NTS X2 retrieved copious amount of yellowish sputum followed by bright red blood.  RT will monitor and assess for more airway clearance as needed.

## 2023-10-09 NOTE — Progress Notes (Signed)
 PCCM Update:  Spoke with family about lack of improvement in her condition, with increasing vasopressor requirements, pulmonary edema and respiratory failure along with on going altered mentation. They understand her condition is not responding to treatment. They wish to keep current care plan going until tomorrow and transition once all family/friends have visited.  Melody Comas, MD [facility] Office: [phone]   See Amion for personal pager PCCM on call pager [phone] until 7pm. Please call Elink 7p-7a. [phone]

## 2023-10-09 NOTE — Telephone Encounter (Signed)
 Pt is currently in the hospital no success speaking with pt

## 2023-10-09 NOTE — Plan of Care (Signed)
  Problem: Nutritional: Goal: Maintenance of adequate nutrition will improve Outcome: Not Progressing   Problem: Clinical Measurements: Goal: Respiratory complications will improve Outcome: Not Progressing

## 2023-10-10 ENCOUNTER — Inpatient Hospital Stay (HOSPITAL_COMMUNITY)

## 2023-10-10 DIAGNOSIS — D649 Anemia, unspecified: Secondary | ICD-10-CM | POA: Diagnosis not present

## 2023-10-10 DIAGNOSIS — G9340 Encephalopathy, unspecified: Secondary | ICD-10-CM | POA: Diagnosis not present

## 2023-10-10 DIAGNOSIS — K7011 Alcoholic hepatitis with ascites: Secondary | ICD-10-CM | POA: Diagnosis not present

## 2023-10-10 DIAGNOSIS — I509 Heart failure, unspecified: Secondary | ICD-10-CM

## 2023-10-10 DIAGNOSIS — G9341 Metabolic encephalopathy: Secondary | ICD-10-CM | POA: Diagnosis not present

## 2023-10-10 DIAGNOSIS — Z7189 Other specified counseling: Secondary | ICD-10-CM | POA: Diagnosis not present

## 2023-10-10 DIAGNOSIS — Z515 Encounter for palliative care: Secondary | ICD-10-CM | POA: Diagnosis not present

## 2023-10-10 DIAGNOSIS — I959 Hypotension, unspecified: Secondary | ICD-10-CM

## 2023-10-10 DIAGNOSIS — K7290 Hepatic failure, unspecified without coma: Secondary | ICD-10-CM | POA: Diagnosis not present

## 2023-10-10 DIAGNOSIS — R7989 Other specified abnormal findings of blood chemistry: Secondary | ICD-10-CM | POA: Diagnosis not present

## 2023-10-10 DIAGNOSIS — K7010 Alcoholic hepatitis without ascites: Secondary | ICD-10-CM | POA: Diagnosis not present

## 2023-10-10 DIAGNOSIS — R579 Shock, unspecified: Secondary | ICD-10-CM | POA: Diagnosis not present

## 2023-10-10 DIAGNOSIS — K7200 Acute and subacute hepatic failure without coma: Secondary | ICD-10-CM | POA: Diagnosis not present

## 2023-10-10 DIAGNOSIS — N179 Acute kidney failure, unspecified: Secondary | ICD-10-CM | POA: Diagnosis not present

## 2023-10-10 LAB — CBC WITH DIFFERENTIAL/PLATELET
Abs Immature Granulocytes: 0.12 10*3/uL — ABNORMAL HIGH (ref 0.00–0.07)
Basophils Absolute: 0 10*3/uL (ref 0.0–0.1)
Basophils Relative: 0 %
Eosinophils Absolute: 0 10*3/uL (ref 0.0–0.5)
Eosinophils Relative: 0 %
HCT: 36.1 % (ref 36.0–46.0)
Hemoglobin: 12.7 g/dL (ref 12.0–15.0)
Immature Granulocytes: 1 %
Lymphocytes Relative: 7 %
Lymphs Abs: 1.5 10*3/uL (ref 0.7–4.0)
MCH: 37.1 pg — ABNORMAL HIGH (ref 26.0–34.0)
MCHC: 35.2 g/dL (ref 30.0–36.0)
MCV: 105.6 fL — ABNORMAL HIGH (ref 80.0–100.0)
Monocytes Absolute: 0.9 10*3/uL (ref 0.1–1.0)
Monocytes Relative: 4 %
Neutro Abs: 19.8 10*3/uL — ABNORMAL HIGH (ref 1.7–7.7)
Neutrophils Relative %: 88 %
Platelets: 90 10*3/uL — ABNORMAL LOW (ref 150–400)
RBC: 3.42 MIL/uL — ABNORMAL LOW (ref 3.87–5.11)
RDW: 24.9 % — ABNORMAL HIGH (ref 11.5–15.5)
WBC: 22.4 10*3/uL — ABNORMAL HIGH (ref 4.0–10.5)
nRBC: 3 % — ABNORMAL HIGH (ref 0.0–0.2)

## 2023-10-10 LAB — COMPREHENSIVE METABOLIC PANEL
ALT: 27 U/L (ref 0–44)
AST: 60 U/L — ABNORMAL HIGH (ref 15–41)
Albumin: 3 g/dL — ABNORMAL LOW (ref 3.5–5.0)
Alkaline Phosphatase: 88 U/L (ref 38–126)
Anion gap: 11 (ref 5–15)
BUN: 6 mg/dL — ABNORMAL LOW (ref 8–23)
CO2: 31 mmol/L (ref 22–32)
Calcium: 8.8 mg/dL — ABNORMAL LOW (ref 8.9–10.3)
Chloride: 98 mmol/L (ref 98–111)
Creatinine, Ser: <0.3 mg/dL — ABNORMAL LOW (ref 0.44–1.00)
Glucose, Bld: 114 mg/dL — ABNORMAL HIGH (ref 70–99)
Potassium: 3.2 mmol/L — ABNORMAL LOW (ref 3.5–5.1)
Sodium: 140 mmol/L (ref 135–145)
Total Bilirubin: 8.7 mg/dL — ABNORMAL HIGH (ref 0.0–1.2)
Total Protein: 6.3 g/dL — ABNORMAL LOW (ref 6.5–8.1)

## 2023-10-10 LAB — IMMUNOGLOBULINS A/E/G/M, SERUM
IgA: 1443 mg/dL — ABNORMAL HIGH (ref 87–352)
IgE (Immunoglobulin E), Serum: 1598 [IU]/mL — ABNORMAL HIGH (ref 6–495)
IgG (Immunoglobin G), Serum: 3157 mg/dL — ABNORMAL HIGH (ref 586–1602)
IgM (Immunoglobulin M), Srm: 98 mg/dL (ref 26–217)

## 2023-10-10 LAB — ECHOCARDIOGRAM LIMITED
Area-P 1/2: 4.68 cm2
Height: 66 in
S' Lateral: 2.8 cm
Weight: 2042.34 [oz_av]

## 2023-10-10 LAB — PROTIME-INR
INR: 3.8 — ABNORMAL HIGH (ref 0.8–1.2)
Prothrombin Time: 37.6 s — ABNORMAL HIGH (ref 11.4–15.2)

## 2023-10-10 LAB — PHOSPHORUS: Phosphorus: 1.6 mg/dL — ABNORMAL LOW (ref 2.5–4.6)

## 2023-10-10 LAB — GLUCOSE, CAPILLARY
Glucose-Capillary: 126 mg/dL — ABNORMAL HIGH (ref 70–99)
Glucose-Capillary: 111 mg/dL — ABNORMAL HIGH (ref 70–99)
Glucose-Capillary: 136 mg/dL — ABNORMAL HIGH (ref 70–99)
Glucose-Capillary: 139 mg/dL — ABNORMAL HIGH (ref 70–99)

## 2023-10-10 LAB — BRAIN NATRIURETIC PEPTIDE: B Natriuretic Peptide: 1452.1 pg/mL — ABNORMAL HIGH (ref 0.0–100.0)

## 2023-10-10 LAB — MAGNESIUM: Magnesium: 1.7 mg/dL (ref 1.7–2.4)

## 2023-10-10 LAB — ALPHA-1-ANTITRYPSIN PHENOTYP: A-1 Antitrypsin, Ser: 89 mg/dL — ABNORMAL LOW (ref 101–187)

## 2023-10-10 MED ORDER — FUROSEMIDE 10 MG/ML IJ SOLN
80.0000 mg | Freq: Once | INTRAMUSCULAR | Status: AC
  Administered 2023-10-10: 80 mg via INTRAVENOUS
  Filled 2023-10-10: qty 8

## 2023-10-10 MED ORDER — GLYCOPYRROLATE 0.2 MG/ML IJ SOLN
0.2000 mg | INTRAMUSCULAR | Status: DC | PRN
  Administered 2023-10-11: 0.2 mg via INTRAVENOUS
  Filled 2023-10-10: qty 1

## 2023-10-10 MED ORDER — POTASSIUM PHOSPHATES 15 MMOLE/5ML IV SOLN
30.0000 mmol | Freq: Once | INTRAVENOUS | Status: AC
  Administered 2023-10-10: 30 mmol via INTRAVENOUS
  Filled 2023-10-10 (×2): qty 10

## 2023-10-10 MED ORDER — METOLAZONE 5 MG PO TABS
5.0000 mg | ORAL_TABLET | Freq: Once | ORAL | Status: AC
  Administered 2023-10-10: 5 mg via ORAL
  Filled 2023-10-10: qty 1

## 2023-10-10 MED ORDER — GLYCOPYRROLATE 0.2 MG/ML IJ SOLN: 0.2000 mg | INTRAMUSCULAR | Status: DC | PRN

## 2023-10-10 MED ORDER — MAGNESIUM SULFATE 2 GM/50ML IV SOLN
2.0000 g | Freq: Once | INTRAVENOUS | Status: AC
  Administered 2023-10-10: 2 g via INTRAVENOUS
  Filled 2023-10-10: qty 50

## 2023-10-10 MED ORDER — LORAZEPAM 2 MG/ML IJ SOLN: 2.0000 mg | INTRAMUSCULAR | Status: DC | PRN

## 2023-10-10 MED ORDER — HYDROCORTISONE SOD SUC (PF) 100 MG IJ SOLR
100.0000 mg | Freq: Three times a day (TID) | INTRAMUSCULAR | Status: DC
  Administered 2023-10-10: 100 mg via INTRAVENOUS
  Filled 2023-10-10: qty 2

## 2023-10-10 MED ORDER — POLYVINYL ALCOHOL 1.4 % OP SOLN: 1.0000 [drp] | Freq: Four times a day (QID) | OPHTHALMIC | Status: DC | PRN

## 2023-10-10 MED ORDER — GLYCOPYRROLATE 1 MG PO TABS: 1.0000 mg | ORAL_TABLET | ORAL | Status: DC | PRN

## 2023-10-10 MED ORDER — ACETAMINOPHEN 325 MG PO TABS: 650.0000 mg | ORAL_TABLET | Freq: Four times a day (QID) | ORAL | Status: DC | PRN

## 2023-10-10 MED ORDER — MORPHINE BOLUS VIA INFUSION: 2.0000 mg | INTRAVENOUS | Status: DC | PRN

## 2023-10-10 MED ORDER — SODIUM CHLORIDE 0.9 % IV SOLN: INTRAVENOUS | Status: DC

## 2023-10-10 MED ORDER — MORPHINE 100MG IN NS 100ML (1MG/ML) PREMIX INFUSION
0.0000 mg/h | INTRAVENOUS | Status: DC
Start: 2023-10-10 — End: 2023-10-11
  Administered 2023-10-10: 5 mg/h via INTRAVENOUS
  Filled 2023-10-10: qty 100

## 2023-10-10 MED ORDER — ACETAMINOPHEN 650 MG RE SUPP: 650.0000 mg | Freq: Four times a day (QID) | RECTAL | Status: DC | PRN

## 2023-10-10 NOTE — Progress Notes (Signed)
   10/10/23 1156  TOC Brief Assessment  Insurance and Status Reviewed  Patient has primary care physician Yes (PCP Nelwyn Salisbury, MD)  Home environment has been reviewed Home alone  Prior level of function: Independent  Prior/Current Home Services No current home services  Social Drivers of Health Review SDOH reviewed no interventions necessary  Readmission risk has been reviewed Yes  Transition of care needs no transition of care needs at this time

## 2023-10-10 NOTE — Progress Notes (Signed)
 NAME:  Stephanie Schmidt, MRN:  [id number], DOB:  24-Jan-1961, LOS: 4 ADMISSION DATE:  10/06/2023, CONSULTATION DATE:  10/06/23 REFERRING MD:  Dr. Silverio Lay CHIEF COMPLAINT:  Acute Liver Failure   History of Present Illness:  63 year old cachectic female accompanied by her boyfriend and sister.  She has not seen a primary care physician in at least 3 years.  Last seen by [facility] in 2018 for epigastric pain with differential diagnosis of gastritis versus PUD.  Appears the last few years patient's been a heavy alcoholic with his also history of lupus not otherwise specified.  Significant weight loss and cachexia prior to admission.  She is status post splenectomy for unclear reasons   Presented to the ED at [facility] long on 10/06/2023 with worsening confusion over a few months.  Few months ago had a fall and hit her head [CT head negative] also history of decreased oral intake and weight loss and jaundice.  In the ED looked extremely frail and cachectic and dehydrated.  2 L fluid given and requiring 4 mcg of Levophed.  Labs show greater than 15 lactic acid, bilirubin greater than 8, AST 148/ALT 48 and highly concerning for acute alcoholic hepatitis.  With an INR of 2 flu and COVID PCR negative.  CT abdomen showed fatty liver along with very small ascites.  Tylenol level normal   Chest x-ray with some concern of left upper lobe nodule but otherwise clear/ - CT chest with some emphyaema   CCM asked to admit.  Pertinent  Medical History  has a past medical history of Benign hematuria, Collapse, lung, Collar bone fracture, Concussion, Dislocated knee, Hemorrhage in the brain [facility]), Internal hemorrhoids, Lupus, Pulmonary sarcoidosis (HCC), Right hand fracture, and Tubular adenoma of colon.    reports that she quit smoking about 36 years ago. Her smoking use included cigarettes. She has never used smokeless tobacco.  Significant Hospital Events: Including procedures, antibiotic start and stop dates in addition to  other pertinent events   3/2 admitted for shock. Acute alcoholic hepatitis and delirium 3/3 tolerating some PO meds, remains confused 3/4 more altered, NG tube placed, developed pulmonary edema and increasing O2 requirements, given 2 units of PRBCs, intermittently agitated 3/5 remains altered, was off levophed briefly, pulmonary edema foam being suctioned orally, updated family about concerns of end of life given lack of improvement, palliative following   Interim History / Subjective:   No acute events overnight On of levophed Remains altered, on HHFNC Oral foaming appears decreasing in frequency/amount   Objective   Blood pressure (!) 81/42, pulse (!) 111, temperature 98.5 F (36.9 C), temperature source Axillary, resp. rate 18, height 5\' 6"  (1.676 m), weight 57.9 kg, SpO2 94%.    FiO2 (%):  [65 %-100 %] 65 %   Intake/Output Summary (Last 24 hours) at 10/10/2023 0743 Last data filed at 10/10/2023 4098 Gross per 24 hour  Intake 2044.58 ml  Output 3270 ml  Net -1225.42 ml   Filed Weights   10/08/23 0500 10/09/23 0405 10/10/23 0500  Weight: 55.8 kg 57.5 kg 57.9 kg    Examination: General: chronically ill woman, no distress, HHFNC in place HENT: [place]/AT, scleral icterus Lungs: diffuse crackles Cardiovascular: rrr, no murmurs Abdomen: soft, non-tender, BS+ Extremities: warm, trace edema Neuro: mildly agitated, moving all extremities GU: foley  Labs  alpha-1 antitrypsin level 20 INR 3.8   Resolved Hospital Problem list   hyponatremia AKI Lactic Acidosis  Assessment & Plan:  Shock - hypovolemic vs septic vs  adrenal insufficiency - Echo is normal 3/2 - continue antibiotic coverage with zosyn - Follow up cultures - MAP goal 65 or greater - continue peripheral levophed - midodrine 10mg  TID per tube - stress dose steroids, hydrocort 100mg  TID  Acute Hypoxemic Respiratory Failure Acute Pulmonary Edema Concern for aspiration pneumonia Centrilobular Emphysema,  Mild - Goal SpO2 92% or higher, wean O2 as able -Chest x-ray with persistent bilateral pulmonary congestion and infiltrates - on zosyn - continue lasix IV, add metolazone today - brovana and yupelri nebs  Acute Metabolic Encephalopathy Agitation - CT head unremarkable - continue lactulose 30mg  TID per tube - precedex PRN for agitaiton - fentanyl PRN added for comfort  Hypophosphatemia Hyopkalemia - replete phos - replete potassium  Alcoholic Hepatitis Cirrhosis Ascites Alpha-1 antitrypsin level is low, possible contributing factor to cirrhosis along with alcohol abuse. Unsure if autoimmune component of hepatitis. - GI following - trend LFTs and bilirubin level - prednisolone 40mg  daily, started 3/2 - changed to hydrocortisone on 3/6 - lactulose 30g TID  Anemia Thrombocytopenia Coagulopathy, INR 2.8 Epistaxis noted yesterday. No retroperitoneal hematoma on CT imaging on 3/2. Component of hemodilution, she is 8L positive since admission. - trend H/H and platelets - transfuse for hemoglobin 7g/dL or less - given 2 units PRBCs 3/4  Severe protein calorie malnutrition - trickle tube feeds - nutrition consulted    Best Practice (right click and "Reselect all SmartList Selections" daily)   Diet/type: tubefeeds DVT prophylaxis SCD Pressure ulcer(s): N/A GI prophylaxis: PPI Lines: N/A Foley:  Yes, and it is still needed Code Status:  full code Last date of multidisciplinary goals of care discussion [palliative care is following, appreciate assistance. Patient is DNR/DNI. Likely transition to comfort care today]  Labs   CBC: Recent Labs  Lab 10/06/23 1037 10/06/23 1804 10/06/23 2020 10/07/23 0249 10/08/23 0307 10/08/23 2109 10/09/23 0310  WBC 9.4  --  14.2* 13.2* 12.0*  --  20.1*  NEUTROABS 7.6  --   --  10.5* 10.0*  --  18.2*  HGB 12.9   < > 8.0* 8.3* 6.8* 10.3* 11.0*  HCT 35.8*   < > 21.8* 23.1* 19.1* 29.0* 30.3*  MCV 114.4*  --  114.1* 117.3* 117.2*  --   103.4*  PLT 140*  --  141* 143* 122*  --  105*   < > = values in this interval not displayed.    Basic Metabolic Panel: Recent Labs  Lab 10/06/23 2131 10/07/23 0249 10/08/23 0307 10/09/23 0310 10/09/23 1539 10/09/23 1712 10/10/23 0634  NA  --  129* 130* 134* 138  --  140  K  --  3.6 3.0* 3.0* 3.6  --  3.2*  CL  --  93* 94* 95* 97*  --  98  CO2  --  24 25 26 25   --  31  GLUCOSE  --  145* 133* 126* 111*  --  114*  BUN  --  9 <5* <5* <5*  --  6*  CREATININE  --  0.66 0.43* 0.38* 0.53  --  <0.30*  CALCIUM  --  8.5* 8.7* 9.0 9.2  --  8.8*  MG 1.3* 2.7* 2.0 1.6* 2.1  --  1.7  PHOS 2.3* 2.8 2.5  --   --  2.3* 1.6*   GFR: CrCl cannot be calculated (This lab value cannot be used to calculate CrCl because it is not a number: <0.30). Recent Labs  Lab 10/06/23 1626 10/06/23 1851 10/06/23 2020 10/07/23 0249 10/08/23 6045 10/09/23 0310  PROCALCITON  --  0.30  --  0.29  --   --   WBC  --   --  14.2* 13.2* 12.0* 20.1*  LATICACIDVEN >15.0* 3.3*  --   --   --   --     Liver Function Tests: Recent Labs  Lab 10/07/23 0249 10/08/23 0307 10/08/23 0939 10/09/23 0310 10/10/23 0634  AST 125* 89* 82* 73* 60*  ALT 45* 33 30 31 27   ALKPHOS 146* 93 94 90 88  BILITOT 9.4* 8.0* 7.9* 8.8* 8.7*  PROT 7.3 6.9 7.1 7.7 6.3*  ALBUMIN 2.0* 3.3* 3.5 4.3 3.0*   Recent Labs  Lab 10/06/23 1851  LIPASE 31  AMYLASE 40   Recent Labs  Lab 10/06/23 1318 10/06/23 1851  AMMONIA 19 53*    ABG    Component Value Date/Time   PHART 7.606 (HH) 10/06/2023 1804   PCO2ART 27.8 (L) 10/06/2023 1804   PO2ART 29 (LL) 10/06/2023 1804   HCO3 27.8 10/06/2023 1804   TCO2 29 10/06/2023 1804   O2SAT 72 10/06/2023 1804     Coagulation Profile: Recent Labs  Lab 10/06/23 1851 10/07/23 0249 10/08/23 0307 10/09/23 0310 10/10/23 0634  INR 2.0* 2.0* 2.3* 2.8* 3.8*    Cardiac Enzymes: No results for input(s): "CKTOTAL", "CKMB", "CKMBINDEX", "TROPONINI" in the last 168 hours.  HbA1C: Hgb A1c MFr  Bld  Date/Time Value Ref Range Status  07/28/2021 10:10 AM 5.1 4.6 - 6.5 % Final    Comment:    Glycemic Control Guidelines for People with Diabetes:Non Diabetic:  <6%Goal of Therapy: <7%Additional Action Suggested:  >8%     CBG: Recent Labs  Lab 10/09/23 1544 10/09/23 1937 10/09/23 2307 10/10/23 0336 10/10/23 0718  GLUCAP 104* 142* 131* 126* 111*      Critical care time: 35 minutes    The patient Samanvitha Hauge is critically ill with multiple organ systems failure and requires high complexity decision making for assessment and support, frequent evaluation and titration of therapies, application of advanced monitoring technologies and extensive interpretation of multiple databases.   Melody Comas, MD [facility] Office: [phone]   See Amion for personal pager PCCM on call pager [phone] until 7pm. Please call Elink 7p-7a. [phone]
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
## 2023-10-10 NOTE — Plan of Care (Signed)
  Problem: Metabolic: Goal: Ability to maintain appropriate glucose levels will improve Outcome: Progressing   Problem: Nutritional: Goal: Maintenance of adequate nutrition will improve Outcome: Progressing   Problem: Nutrition: Goal: Adequate nutrition will be maintained Outcome: Progressing   Problem: Health Behavior/Discharge Planning: Goal: Ability to manage health-related needs will improve Outcome: Not Progressing   Problem: Tissue Perfusion: Goal: Adequacy of tissue perfusion will improve Outcome: Not Progressing   Problem: Education: Goal: Knowledge of General Education information will improve Description: Including pain rating scale, medication(s)/side effects and non-pharmacologic comfort measures Outcome: Not Progressing   Problem: Clinical Measurements: Goal: Respiratory complications will improve Outcome: Not Progressing Goal: Cardiovascular complication will be avoided Outcome: Not Progressing   Problem: Coping: Goal: Level of anxiety will decrease Outcome: Not Progressing   Problem: Elimination: Goal: Will not experience complications related to bowel motility Outcome: Not Progressing   Problem: Pain Managment: Goal: General experience of comfort will improve and/or be controlled Outcome: Not Progressing

## 2023-10-10 NOTE — Progress Notes (Signed)
 Palliative Medicine Progress Note   Patient Name: Stephanie Schmidt       Date: 10/10/2023 DOB: Nov 21, 1960  Age: 63 y.o. MRN#: [id number] Attending Physician: Martina Sinner, MD Primary Care Physician: Nelwyn Salisbury, MD Admit Date: 10/06/2023    HPI/Patient Profile: 76 year old cachectic female accompanied by her boyfriend and sister. She has not seen a primary care physician in at least 3 years. Last seen by [facility] in 2018 for epigastric pain with differential diagnosis of gastritis versus PUD. Palliative care asked to get involved to support additional goals of care conversations.   Subjective: Extensive chart review has been completed including labs, vital signs, imaging, progress/consult notes, orders, medications and available advance directive documents.   I assessed patient at bedside. Update received from RN.   15:45 - I later spoke with sister/Thelma by phone. She states that family has decided to transition to comfort care.   Reviewed that comfort care involves de-escalating full scope medical interventions, allowing a natural course to occur. Discussed that the goal is comfort and dignity rather than prolonging life.   Discussed transitioning to comfort care in the hospital, and what that would look like--keeping her clean and dry, no artificial hydration or feeding, minimizing of medications, and medication for pain and dyspnea.  We discussed stopping all uneccessary measures such as cardiac monitoring, blood draws, needle sticks, and frequent vital signs. We discussed that vasopressor support would be decreased, then stopped.   Emotional support provided. Questions and concerns were addressed. Wilnette Kales wants to begin transition to comfort care this evening after family has  finished visiting.    Objective:  Physical Exam Vitals reviewed.  Constitutional:      General: She is not in acute distress.    Appearance: She is ill-appearing.  Pulmonary:     Effort: No respiratory distress.  Neurological:     Comments: Not following commands           O2 Device: O2 Device: Heated High Flow Nasal Cannula O2 Flow Rate: O2 Flow Rate (L/min): 50 L/min   Palliative Medicine Assessment & Plan   Assessment: Principal Problem:   Acute alcoholic hepatitis Active Problems:   Acute liver failure without hepatic coma   Encephalopathy   Hypotension   Abnormal LFTs   Protein-calorie malnutrition, severe    Recommendations/Plan: Transition  to comfort care Morphine infusion  Discontinue labs, CBG checks, tube feeding Minimize medications PMT will continue to follow  Symptom Management:  Lorazepam (ATIVAN) prn for anxiety Haloperidol (HALDOL) prn for agitation  Glycopyrrolate (ROBINUL) for excessive secretions Ondansetron (ZOFRAN) prn for nausea Polyvinyl alcohol (LIQUIFILM TEARS) prn for dry eyes Antiseptic oral rinse (BIOTENE) prn for dry mouth   Code Status: DNR - comfort   Prognosis:  Hours - Days  Discharge Planning: Anticipated Hospital Death  Care plan was discussed with Dr. Francine Graven and patient's RN  Thank you for allowing the Palliative Medicine Team to assist in the care of this patient.   Time: 50 minutes   Merry Proud, NP   Please contact Palliative Medicine Team phone at [phone] for questions and concerns.  For individual providers, please see AMION.

## 2023-10-11 LAB — CULTURE, BLOOD (ROUTINE X 2)
Culture: NO GROWTH
Culture: NO GROWTH

## 2023-11-05 NOTE — Death Summary Note (Signed)
 DEATH SUMMARY   Patient Details  Name: Stephanie Schmidt MRN: [id number] DOB: 06-09-1961  Admission/Discharge Information   Admit Date:  10-13-23  Date of Death: Date of Death: 2023/10/18  Time of Death: Time of Death: 0200  Length of Stay: 5  Referring Physician: Nelwyn Salisbury, MD   Reason(s) for Hospitalization  Acute liver failure   Diagnoses  Preliminary cause of death:  Secondary Diagnoses (including complications and co-morbidities):  Principal Problem:   Acute alcoholic hepatitis Active Problems:   Acute liver failure with hepatic coma (HCC)   Encephalopathy   Hypotension   Abnormal LFTs   Protein-calorie malnutrition, severe   Brief Hospital Course (including significant findings, care, treatment, and services provided and events leading to death)  Stephanie Schmidt is a 63 y.o. year old female with a past history of lupus, pulmonary sarcoidosis and no contact with health care for at least the past three years. She was seen in 27-Oct-2016 by [facility] for gastritis vs PUD. Unfortunately, in that time has had consistent heavy alcohol use, significant weight loss and cachexia prior to her presenting to the ED.   She presented to [facility] ED on 10/13/2023 with altered mental status, falling and striking her head months ago (with negative imaging at that time) and decreased PO intake, weight loss and jaundice. She was initially given fluid resuscitation and started on norepinephrine. She had a significant lactic acidosis to >15, bilirubin >8 AST 148/ALT 48 with concern over acute alcoholic hepatitis. She had CXR showing possible aspiration pneumonia. She was treated with antibiotics. For her ongoing shock she was treated with blood transfusion, antibiotics, peripheral vasopressors, midodrine, and steroids. They also initiated lactulose for her metabolic encephalopathy. On 10/08/23 she became increasingly altered, developed pulmonary edema and increasing O2 requirements with copious oral  foam requiring frequent suctioning. Additionally, she was again anemic requiring 2U of PRBCs. On 10/09/23, palliative was consulted given concern about end of life and lack of improvement.   On 10/10/23 palliative continued to have ongoing conversation about end of life and transitioning to comfort care. Family made decision to focus on patient comfort and she was allowed to expire on 10-18-23 at 0200 with family.    Pertinent Labs and Studies  Significant Diagnostic Studies ECHOCARDIOGRAM LIMITED Result Date: 10/10/2023    ECHOCARDIOGRAM LIMITED REPORT   Patient Name:   Stephanie Schmidt Date of Exam: 10/10/2023 Medical Rec #:  [id number]          Height:       66.0 in Accession #:    [id number]         Weight:       127.6 lb Date of Birth:  05/10/1961           BSA:          1.652 m Patient Age:    62 years           BP:           94/53 mmHg Patient Gender: F                  HR:           100 bpm. Exam Location:  Inpatient Procedure: 2D Echo, Cardiac Doppler and Color Doppler (Both Spectral and Color            Flow Doppler were utilized during procedure). Indications:    Congestive Heart Failure I50.9  History:        Patient  has prior history of Echocardiogram examinations, most                 recent 10/07/2023. Risk Factors:Hypertension.  Sonographer:    Webb Laws Referring Phys: [id number] JONATHAN B DEWALD IMPRESSIONS  1. Left ventricular ejection fraction, by estimation, is 65 to 70%. The left ventricle has normal function. The left ventricle has no regional wall motion abnormalities. Left ventricular diastolic parameters were normal.  2. Right ventricular systolic function is normal. The right ventricular size is normal.  3. The mitral valve is normal in structure. Trivial mitral valve regurgitation.  4. The aortic valve is tricuspid. Aortic valve regurgitation is not visualized. Aortic valve sclerosis is present, with no evidence of aortic valve stenosis. Comparison(s): The left ventricular function is  unchanged. FINDINGS  Left Ventricle: Left ventricular ejection fraction, by estimation, is 65 to 70%. The left ventricle has normal function. The left ventricle has no regional wall motion abnormalities. The left ventricular internal cavity size was normal in size. There is  no left ventricular hypertrophy. Left ventricular diastolic parameters were normal. Right Ventricle: The right ventricular size is normal. Right vetricular wall thickness was not assessed. Right ventricular systolic function is normal. Left Atrium: Left atrial size was normal in size. Right Atrium: Right atrial size was normal in size. Pericardium: There is no evidence of pericardial effusion. Mitral Valve: The mitral valve is normal in structure. Trivial mitral valve regurgitation. Tricuspid Valve: The tricuspid valve is normal in structure. Tricuspid valve regurgitation is mild. Aortic Valve: The aortic valve is tricuspid. Aortic valve regurgitation is not visualized. Aortic valve sclerosis is present, with no evidence of aortic valve stenosis. Pulmonic Valve: The pulmonic valve was normal in structure. Pulmonic valve regurgitation is trivial. Aorta: The aortic root and ascending aorta are structurally normal, with no evidence of dilitation. Venous: The inferior vena cava was not well visualized. LEFT VENTRICLE PLAX 2D LVIDd:         4.30 cm   Diastology LVIDs:         2.80 cm   LV e' medial:    9.90 cm/s LV PW:         0.60 cm   LV E/e' medial:  10.5 LV IVS:        0.70 cm   LV e' lateral:   16.80 cm/s LVOT diam:     2.00 cm   LV E/e' lateral: 6.2 LV SV:         63 LV SV Index:   38 LVOT Area:     3.14 cm  RIGHT VENTRICLE RV Basal diam:  3.00 cm RV S prime:     12.70 cm/s TAPSE (M-mode): 2.4 cm LEFT ATRIUM             Index        RIGHT ATRIUM          Index LA diam:        3.10 cm 1.88 cm/m   RA Area:     9.52 cm LA Vol (A2C):   22.3 ml 13.50 ml/m  RA Volume:   18.60 ml 11.26 ml/m LA Vol (A4C):   24.3 ml 14.71 ml/m LA Biplane Vol: 25.1  ml 15.19 ml/m  AORTIC VALVE LVOT Vmax:   113.00 cm/s LVOT Vmean:  70.200 cm/s LVOT VTI:    0.202 m  AORTA Ao Root diam: 2.80 cm Ao Asc diam:  2.80 cm MITRAL VALVE  TRICUSPID VALVE MV Area (PHT): 4.68 cm     TR Peak grad:   11.8 mmHg MV E velocity: 104.00 cm/s  TR Vmax:        172.00 cm/s MV A velocity: 62.20 cm/s MV E/A ratio:  1.67         SHUNTS                             Systemic VTI:  0.20 m                             Systemic Diam: 2.00 cm Dietrich Pates MD Electronically signed by Dietrich Pates MD Signature Date/Time: 10/10/2023/6:07:59 PM    Final    DG CHEST PORT 1 VIEW Result Date: 10/10/2023 CLINICAL DATA:  Respiratory failure EXAM: PORTABLE CHEST 1 VIEW COMPARISON:  10/09/2023 FINDINGS: Severe diffuse bilateral airspace disease, unchanged. Heart and mediastinal contours are stable. Suspect small right pleural effusion. No pneumothorax. IMPRESSION: Continued severe diffuse bilateral airspace disease. Small right effusion. Electronically Signed   By: Charlett Nose M.D.   On: 10/10/2023 11:51   DG CHEST PORT 1 VIEW Result Date: 10/09/2023 CLINICAL DATA:  Respiratory failure EXAM: PORTABLE CHEST 1 VIEW COMPARISON:  X-ray 10/09/2023 earlier FINDINGS: Enteric tube in place. Overlapping cardiac leads. Extensive bilateral lung opacities identified most confluent perihilar and worrisome for edema. Small pleural effusions. No pneumothorax. Enlarged cardiopericardial silhouette. Expansile appearance of the right clavicle stable. Please correlate with history. IMPRESSION: Extensive perihilar lung opacities with interstitial components and small effusions. Favor edema Electronically Signed   By: Karen Kays M.D.   On: 10/09/2023 11:41   DG CHEST PORT 1 VIEW Result Date: 10/09/2023 CLINICAL DATA:  Aspiration EXAM: PORTABLE CHEST 1 VIEW COMPARISON:  Film from the previous day. FINDINGS: Cardiac shadow is stable. Feeding catheter is noted extending into the stomach. Diffuse bilateral airspace opacities are  again identified. Small effusions are noted bilaterally. IMPRESSION: Significant airspace opacities bilaterally increased in the interval from the prior exam. Small effusions are noted. Electronically Signed   By: Alcide Clever M.D.   On: 10/09/2023 03:25   DG Abd 1 View Result Date: 10/09/2023 CLINICAL DATA:  Recent aspiration EXAM: ABDOMEN - 1 VIEW COMPARISON:  None Available. FINDINGS: Scattered large and small bowel gas is noted. Weighted feeding catheter is noted within the third portion of the duodenum. Postsurgical changes are seen. No obstructive change is noted. IMPRESSION: Weighted feeding catheter within the third portion of the duodenum. Electronically Signed   By: Alcide Clever M.D.   On: 10/09/2023 03:24   DG CHEST PORT 1 VIEW Result Date: 10/08/2023 CLINICAL DATA:  04540 Respiratory failure (HCC) [id number] EXAM: PORTABLE CHEST - 1 VIEW COMPARISON:  10/06/2023 FINDINGS: Interval development of perihilar and bibasilar airspace opacities with some peripheral sparing. Subpleural interstitial lines are noted laterally at the right lung base. Left retrocardiac consolidation. Heart size and mediastinal contours are within normal limits. No effusion. Visualized bones unremarkable. IMPRESSION: Interval development of perihilar and bibasilar airspace disease, with left retrocardiac consolidation. Electronically Signed   By: Corlis Leak M.D.   On: 10/08/2023 13:33   DG Abd 1 View Result Date: 10/08/2023 CLINICAL DATA:  [id number] Encounter for nasogastric (NG) tube placement [id number] EXAM: ABDOMEN - 1 VIEW COMPARISON:  CT 10/06/2023 FINDINGS: Weighted tip feeding tube loops in the stomach, tip midbody. Normal bowel gas pattern. Laparoscopic hernia repair sutures. IMPRESSION:  Feeding tube loops in the stomach. Electronically Signed   By: Corlis Leak M.D.   On: 10/08/2023 13:30   EEG adult Result Date: 10/07/2023 Charlsie Quest, MD     10/07/2023  5:05 PM Patient Name: Stephanie Schmidt MRN: [id number] Epilepsy  Attending: Charlsie Quest Referring Physician/Provider: Kalman Shan, MD Date: 10/07/2023 Duration: 24.16 mins Patient history: 63yo F with ams. EEG to evaluate for seizure Level of alertness: Awake, asleep AEDs during EEG study: Ativan Technical aspects: This EEG study was done with scalp electrodes positioned according to the 10-20 International system of electrode placement. Electrical activity was reviewed with band pass filter of 1-70Hz , sensitivity of 7 uV/mm, display speed of 55mm/sec with a 60Hz  notched filter applied as appropriate. EEG data were recorded continuously and digitally stored.  Video monitoring was available and reviewed as appropriate. Description: The posterior dominant rhythm consists of 8-9 Hz activity of moderate voltage (25-35 uV) seen predominantly in posterior head regions, symmetric and reactive to eye opening and eye closing. Sleep was characterized by vertex waves, sleep spindles (12 to 14 Hz), maximal frontocentral region. EEG showed intermittent generalized 3 to 6 Hz theta-delta slowing. Hyperventilation and photic stimulation were not performed.   ABNORMALITY - Intermittent slow, generalized IMPRESSION: This study is suggestive of mild diffuse encephalopathy. No seizures or epileptiform discharges were seen throughout the recording. Charlsie Quest   ECHOCARDIOGRAM COMPLETE Result Date: 10/07/2023    ECHOCARDIOGRAM REPORT   Patient Name:   Stephanie Schmidt Date of Exam: 10/07/2023 Medical Rec #:  [id number]          Height:       66.0 in Accession #:    [id number]         Weight:       122.1 lb Date of Birth:  1960-12-08           BSA:          1.622 m Patient Age:    62 years           BP:           95/57 mmHg Patient Gender: F                  HR:           104 bpm. Exam Location:  Inpatient Procedure: 2D Echo, Cardiac Doppler and Color Doppler (Both Spectral and Color            Flow Doppler were utilized during procedure). Indications:    Cardiomyopathy-Unspecified I42.9   History:        Patient has no prior history of Echocardiogram examinations.  Sonographer:    Darlys Gales Referring Phys: [id number] MURALI RAMASWAMY IMPRESSIONS  1. Left ventricular ejection fraction, by estimation, is 60 to 65%. The left ventricle has normal function. The left ventricle has no regional wall motion abnormalities. Left ventricular diastolic parameters were normal.  2. Peak RV-RA gradient 14 mmHg. Right ventricular systolic function is normal. The right ventricular size is normal.  3. The mitral valve is normal in structure. No evidence of mitral valve regurgitation. No evidence of mitral stenosis.  4. The aortic valve is tricuspid. Aortic valve regurgitation is not visualized. No aortic stenosis is present.  5. The IVC was not visualized. FINDINGS  Left Ventricle: Left ventricular ejection fraction, by estimation, is 60 to 65%. The left ventricle has normal function. The left ventricle has no regional wall motion abnormalities. The left ventricular internal cavity size  was normal in size. There is  no left ventricular hypertrophy. Left ventricular diastolic parameters were normal. Right Ventricle: Peak RV-RA gradient 14 mmHg. The right ventricular size is normal. No increase in right ventricular wall thickness. Right ventricular systolic function is normal. Left Atrium: Left atrial size was normal in size. Right Atrium: Right atrial size was normal in size. Pericardium: There is no evidence of pericardial effusion. Mitral Valve: The mitral valve is normal in structure. No evidence of mitral valve regurgitation. No evidence of mitral valve stenosis. Tricuspid Valve: The tricuspid valve is normal in structure. Tricuspid valve regurgitation is trivial. Aortic Valve: The aortic valve is tricuspid. Aortic valve regurgitation is not visualized. No aortic stenosis is present. Aortic valve mean gradient measures 6.0 mmHg. Aortic valve peak gradient measures 10.1 mmHg. Aortic valve area, by VTI measures 2.48   cm. Pulmonic Valve: The pulmonic valve was normal in structure. Pulmonic valve regurgitation is trivial. Aorta: The aortic root is normal in size and structure. Venous: The IVC was not visualized. The inferior vena cava was not well visualized. IAS/Shunts: No atrial level shunt detected by color flow Doppler.  LEFT VENTRICLE PLAX 2D LVIDd:         4.20 cm   Diastology LVIDs:         2.00 cm   LV e' medial:    12.60 cm/s LV PW:         0.70 cm   LV E/e' medial:  10.4 LV IVS:        0.70 cm   LV e' lateral:   12.10 cm/s LVOT diam:     2.00 cm   LV E/e' lateral: 10.8 LV SV:         70 LV SV Index:   43 LVOT Area:     3.14 cm  RIGHT VENTRICLE RV S prime:     15.40 cm/s TAPSE (M-mode): 4.3 cm LEFT ATRIUM             Index        RIGHT ATRIUM          Index LA Vol (A2C):   25.9 ml 15.97 ml/m  RA Area:     9.10 cm LA Vol (A4C):   20.9 ml 12.89 ml/m  RA Volume:   17.40 ml 10.73 ml/m LA Biplane Vol: 23.9 ml 14.74 ml/m  AORTIC VALVE AV Area (Vmax):    2.55 cm AV Area (Vmean):   2.17 cm AV Area (VTI):     2.48 cm AV Vmax:           159.00 cm/s AV Vmean:          116.000 cm/s AV VTI:            0.284 m AV Peak Grad:      10.1 mmHg AV Mean Grad:      6.0 mmHg LVOT Vmax:         129.00 cm/s LVOT Vmean:        80.200 cm/s LVOT VTI:          0.224 m LVOT/AV VTI ratio: 0.79  AORTA Ao Root diam: 3.00 cm MITRAL VALVE                TRICUSPID VALVE MV Area (PHT): 4.24 cm     TR Peak grad:   14.3 mmHg MV Decel Time: 179 msec     TR Vmax:        189.00 cm/s MV E velocity: 131.00  cm/s MV A velocity: 92.70 cm/s   SHUNTS MV E/A ratio:  1.41         Systemic VTI:  0.22 m                             Systemic Diam: 2.00 cm Dalton McleanMD Electronically signed by Wilfred Lacy Signature Date/Time: 10/07/2023/8:24:48 AM    Final    CT CHEST ABDOMEN PELVIS W CONTRAST Result Date: 10/06/2023 CLINICAL DATA:  Occult malignancy, abdominal pain, jaundice * Tracking Code: BO * EXAM: CT CHEST, ABDOMEN, AND PELVIS WITH CONTRAST TECHNIQUE:  Multidetector CT imaging of the chest, abdomen and pelvis was performed following the standard protocol during bolus administration of intravenous contrast. RADIATION DOSE REDUCTION: This exam was performed according to the departmental dose-optimization program which includes automated exposure control, adjustment of the mA and/or kV according to patient size and/or use of iterative reconstruction technique. CONTRAST:  80mL OMNIPAQUE IOHEXOL 300 MG/ML  SOLN COMPARISON:  09/28/2016 FINDINGS: CT CHEST FINDINGS Cardiovascular: No significant vascular findings. Normal heart size. Left coronary artery calcifications. No pericardial effusion. Mediastinum/Nodes: No enlarged mediastinal, hilar, or axillary lymph nodes. Thyroid gland, trachea, and esophagus demonstrate no significant findings. Lungs/Pleura: Minimal centrilobular emphysema. Mild bibasilar scarring or atelectasis. No pleural effusion or pneumothorax. Musculoskeletal: No chest wall abnormality. No acute osseous findings. Chronic fracture deformities of the right clavicle and right ribs. CT ABDOMEN PELVIS FINDINGS Hepatobiliary: Profound hypodensity of the liver parenchyma. No gallstones, gallbladder wall thickening, or biliary dilatation. Pancreas: Unremarkable. No pancreatic ductal dilatation or surrounding inflammatory changes. Spleen: Status post splenectomy with hypertrophic splenules in the left upper quadrant. Adrenals/Urinary Tract: Adrenal glands are unremarkable. Kidneys are normal, without renal calculi, solid lesion, or hydronephrosis. Bladder is unremarkable. Stomach/Bowel: Stomach is within normal limits. Appendix appears normal. No evidence of bowel wall thickening, distention, or inflammatory changes. Vascular/Lymphatic: Aortic atherosclerosis. No enlarged abdominal or pelvic lymph nodes. Reproductive: No mass or other abnormality. Other: Midline ventral hernia mesh repair. Small volume simple appearing ascites in the pelvis. Musculoskeletal: No  acute osseous findings. IMPRESSION: 1. No CT evidence of malignancy in the chest, abdomen, or pelvis. 2. Profound hypodensity of the liver parenchyma, suggesting some combination of steatosis and hepatitis. No biliary ductal dilatation. 3. Small volume simple appearing ascites in the pelvis. 4. 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 5. Coronary artery disease. Aortic Atherosclerosis (ICD10-I70.0) and Emphysema (ICD10-J43.9). Electronically Signed   By: Jearld Lesch M.D.   On: 10/06/2023 15:25   CT Head Wo Contrast Result Date: 10/06/2023 CLINICAL DATA:  Altered mentation, scleral jaundice, recent head trauma. EXAM: CT HEAD WITHOUT CONTRAST TECHNIQUE: Contiguous axial images were obtained from the base of the skull through the vertex without intravenous contrast. RADIATION DOSE REDUCTION: This exam was performed according to the departmental dose-optimization program which includes automated exposure control, adjustment of the mA and/or kV according to patient size and/or use of iterative reconstruction technique. COMPARISON:  None Available. FINDINGS: Brain: No evidence of acute infarction, hemorrhage, hydrocephalus, extra-axial collection or mass lesion/mass effect. There is mild cerebral volume loss with associated ex vacuo dilatation. Vascular: There are vascular calcifications in the carotid siphons. Skull: Normal. Negative for fracture or focal lesion. Sinuses/Orbits: No acute finding. Other: None. IMPRESSION: No acute intracranial process. Electronically Signed   By: Romona Curls M.D.   On: 10/06/2023 11:38   DG Chest Portable 1 View Result Date: 10/06/2023 CLINICAL DATA:  Altered mental  status.  Jaundice. EXAM: PORTABLE CHEST 1 VIEW COMPARISON:  Chest radiographs 07/28/2021 and 05/20/2020. Neck CT 05/19/2019. FINDINGS: 1059 hours. The heart size and mediastinal contours are normal. Indeterminate nodular density projecting over the left costophrenic angle was not  seen previously and could reflect a nipple shadow, healing rib fracture or pulmonary nodule. The lungs are otherwise clear. There is no pleural effusion or pneumothorax. Old fractures of the right clavicle and several ribs are grossly unchanged. No acute osseous findings are seen. Telemetry leads overlie the chest. IMPRESSION: 1. No evidence of acute cardiopulmonary process. 2. Indeterminate new nodular density projecting over the left lung base with further discussion as above. Consider further evaluation with left rib series. Electronically Signed   By: Carey Bullocks M.D.   On: 10/06/2023 11:31    Microbiology Recent Results (from the past 240 hours)  Resp panel by RT-PCR (RSV, Flu A&B, Covid) Anterior Nasal Swab     Status: None   Collection Time: 10/06/23  3:45 PM   Specimen: Anterior Nasal Swab  Result Value Ref Range Status   SARS Coronavirus 2 by RT PCR NEGATIVE NEGATIVE Final    Comment: (NOTE) SARS-CoV-2 target nucleic acids are NOT DETECTED.  The SARS-CoV-2 RNA is generally detectable in upper respiratory specimens during the acute phase of infection. The lowest concentration of SARS-CoV-2 viral copies this assay can detect is 138 copies/mL. A negative result does not preclude SARS-Cov-2 infection and should not be used as the sole basis for treatment or other patient management decisions. A negative result may occur with  improper specimen collection/handling, submission of specimen other than nasopharyngeal swab, presence of viral mutation(s) within the areas targeted by this assay, and inadequate number of viral copies(<138 copies/mL). A negative result must be combined with clinical observations, patient history, and epidemiological information. The expected result is Negative.  Fact Sheet for Patients:  BloggerCourse.com  Fact Sheet for Healthcare Providers:  SeriousBroker.it  This test is no t yet approved or cleared  by the [place] FDA and  has been authorized for detection and/or diagnosis of SARS-CoV-2 by FDA under an Emergency Use Authorization (EUA). This EUA will remain  in effect (meaning this test can be used) for the duration of the COVID-19 declaration under Section 564(b)(1) of the Act, 21 U.S.C.section 360bbb-3(b)(1), unless the authorization is terminated  or revoked sooner.       Influenza A by PCR NEGATIVE NEGATIVE Final   Influenza B by PCR NEGATIVE NEGATIVE Final    Comment: (NOTE) The Xpert Xpress SARS-CoV-2/FLU/RSV plus assay is intended as an aid in the diagnosis of influenza from Nasopharyngeal swab specimens and should not be used as a sole basis for treatment. Nasal washings and aspirates are unacceptable for Xpert Xpress SARS-CoV-2/FLU/RSV testing.  Fact Sheet for Patients: BloggerCourse.com  Fact Sheet for Healthcare Providers: SeriousBroker.it  This test is not yet approved or cleared by the [place] FDA and has been authorized for detection and/or diagnosis of SARS-CoV-2 by FDA under an Emergency Use Authorization (EUA). This EUA will remain in effect (meaning this test can be used) for the duration of the COVID-19 declaration under Section 564(b)(1) of the Act, 21 U.S.C. section 360bbb-3(b)(1), unless the authorization is terminated or revoked.     Resp Syncytial Virus by PCR NEGATIVE NEGATIVE Final    Comment: (NOTE) Fact Sheet for Patients: BloggerCourse.com  Fact Sheet for Healthcare Providers: SeriousBroker.it  This test is not yet approved or cleared by the [place] FDA and has been  authorized for detection and/or diagnosis of SARS-CoV-2 by FDA under an Emergency Use Authorization (EUA). This EUA will remain in effect (meaning this test can be used) for the duration of the COVID-19 declaration under Section 564(b)(1) of the Act, 21  U.S.C. section 360bbb-3(b)(1), unless the authorization is terminated or revoked.  Performed at [facility], [address], [place], [place] [address]   Culture, blood (routine x 2)     Status: None   Collection Time: 10/06/23  4:18 PM   Specimen: BLOOD  Result Value Ref Range Status   Specimen Description   Final    BLOOD LEFT ANTECUBITAL Performed at [facility], [address], [place], [place] [address]    Special Requests   Final    BOTTLES DRAWN AEROBIC AND ANAEROBIC Blood Culture results may not be optimal due to an inadequate volume of blood received in culture bottles Performed at [facility], [address] [place], [place] [address]    Culture   Final    NO GROWTH 5 DAYS Performed at [facility], [address] [address] [place], [place] [address]    Report Status 2023-10-16 FINAL  Final  Culture, blood (routine x 2)     Status: None   Collection Time: 10/06/23  4:30 PM   Specimen: BLOOD  Result Value Ref Range Status   Specimen Description   Final    BLOOD BLOOD LEFT WRIST Performed at [facility], [address], [place], [place] [address]    Special Requests   Final    BOTTLES DRAWN AEROBIC AND ANAEROBIC Blood Culture results may not be optimal due to an inadequate volume of blood received in culture bottles Performed at [facility], [address] [place], [place] [address]    Culture   Final    NO GROWTH 5 DAYS Performed at [facility], [address], [place], [place] [address]    Report Status 2023-10-16 FINAL  Final  MRSA Next Gen by PCR, Nasal     Status: None   Collection Time: 10/06/23  6:44 PM   Specimen: Nasal Mucosa; Nasal Swab  Result Value Ref Range Status   MRSA by PCR Next Gen NOT DETECTED NOT DETECTED Final    Comment: (NOTE) The GeneXpert MRSA Assay (FDA approved for NASAL specimens only), is one component of a comprehensive MRSA  colonization surveillance program. It is not intended to diagnose MRSA infection nor to guide or monitor treatment for MRSA infections. Test performance is not FDA approved in patients less than 52 years old. Performed at [facility], [address] [place], [place] [address]     Lab Basic Metabolic Panel: Recent Labs  Lab 10/09/23 1539 10/09/23 1712 10/10/23 0634  NA 138  --  140  K 3.6  --  3.2*  CL 97*  --  98  CO2 25  --  31  GLUCOSE 111*  --  114*  BUN <5*  --  6*  CREATININE 0.53  --  <0.30*  CALCIUM 9.2  --  8.8*  MG 2.1  --  1.7  PHOS  --  2.3* 1.6*   Liver Function Tests: Recent Labs  Lab 10/10/23 0634  AST 60*  ALT 27  ALKPHOS 88  BILITOT 8.7*  PROT 6.3*  ALBUMIN 3.0*   No results for input(s): "LIPASE", "AMYLASE" in the last 168 hours. No results for input(s): "AMMONIA" in the last 168  hours. CBC: Recent Labs  Lab 10/10/23 0626  WBC 22.4*  NEUTROABS 19.8*  HGB 12.7  HCT 36.1  MCV 105.6*  PLT 90*   Cardiac Enzymes: No results for input(s): "CKTOTAL", "CKMB", "CKMBINDEX", "TROPONINI" in the last 168 hours. Sepsis Labs: Recent Labs  Lab 10/10/23 0626  WBC 22.4*    Procedures/Operations  EEG  Labs and imaging as above  Echocardiogram   Cristopher Peru, PA-C [facility] 10/16/23 4:00 PM  Please see [email or web address] for pager details.  From 7A-7P if no response, please call [phone] After hours, please call ELink [phone]

## 2023-11-05 DEATH — deceased
